# Patient Record
Sex: Male | Born: 1950 | Race: Black or African American | Hispanic: No | Marital: Married | Smoking: Never smoker
Health system: Southern US, Community
[De-identification: ages and names within clinical notes are randomized; demographics above are authoritative.]

## PROBLEM LIST (undated history)

## (undated) DIAGNOSIS — M1990 Unspecified osteoarthritis, unspecified site: Secondary | ICD-10-CM

## (undated) DIAGNOSIS — I10 Essential (primary) hypertension: Secondary | ICD-10-CM

## (undated) DIAGNOSIS — E785 Hyperlipidemia, unspecified: Secondary | ICD-10-CM

## (undated) DIAGNOSIS — H269 Unspecified cataract: Secondary | ICD-10-CM

## (undated) HISTORY — PX: OTHER SURGICAL HISTORY: SHX169

## (undated) HISTORY — DX: Unspecified cataract: H26.9

## (undated) HISTORY — DX: Hyperlipidemia, unspecified: E78.5

## (undated) HISTORY — DX: Unspecified osteoarthritis, unspecified site: M19.90

---

## 1993-04-29 HISTORY — DX: Rider (driver) (passenger) of other motorcycle injured in unspecified traffic accident, initial encounter: V29.99XA

## 1993-04-29 HISTORY — PX: OTHER SURGICAL HISTORY: SHX169

## 1997-11-30 ENCOUNTER — Emergency Department (HOSPITAL_COMMUNITY): Admission: EM | Admit: 1997-11-30 | Discharge: 1997-11-30 | Payer: Self-pay | Admitting: Emergency Medicine

## 1999-01-21 ENCOUNTER — Emergency Department (HOSPITAL_COMMUNITY): Admission: EM | Admit: 1999-01-21 | Discharge: 1999-01-21 | Payer: Self-pay | Admitting: Emergency Medicine

## 1999-01-30 ENCOUNTER — Encounter: Payer: Self-pay | Admitting: Orthopedic Surgery

## 1999-01-30 ENCOUNTER — Ambulatory Visit (HOSPITAL_COMMUNITY): Admission: RE | Admit: 1999-01-30 | Discharge: 1999-01-30 | Payer: Self-pay | Admitting: Orthopedic Surgery

## 2003-04-30 HISTORY — PX: KNEE SURGERY: SHX244

## 2007-10-13 ENCOUNTER — Emergency Department (HOSPITAL_COMMUNITY): Admission: EM | Admit: 2007-10-13 | Discharge: 2007-10-13 | Payer: Self-pay | Admitting: Emergency Medicine

## 2009-12-26 ENCOUNTER — Ambulatory Visit (HOSPITAL_COMMUNITY): Payer: Self-pay | Admitting: Psychology

## 2011-01-14 ENCOUNTER — Emergency Department (HOSPITAL_COMMUNITY): Payer: 59

## 2011-01-14 ENCOUNTER — Emergency Department (HOSPITAL_COMMUNITY)
Admission: EM | Admit: 2011-01-14 | Discharge: 2011-01-14 | Disposition: A | Discharge status: Home / Self Care | Payer: 59 | Attending: Emergency Medicine | Admitting: Emergency Medicine

## 2011-01-14 DIAGNOSIS — J189 Pneumonia, unspecified organism: Secondary | ICD-10-CM | POA: Insufficient documentation

## 2011-01-14 DIAGNOSIS — R0789 Other chest pain: Secondary | ICD-10-CM | POA: Insufficient documentation

## 2011-01-14 DIAGNOSIS — I10 Essential (primary) hypertension: Secondary | ICD-10-CM | POA: Insufficient documentation

## 2011-01-14 DIAGNOSIS — I498 Other specified cardiac arrhythmias: Secondary | ICD-10-CM | POA: Insufficient documentation

## 2011-01-14 DIAGNOSIS — E7800 Pure hypercholesterolemia, unspecified: Secondary | ICD-10-CM | POA: Insufficient documentation

## 2011-01-14 LAB — POCT I-STAT TROPONIN I: Troponin i, poc: 0 ng/mL (ref 0.00–0.08)

## 2011-01-14 LAB — DIFFERENTIAL
Basophils Absolute: 0 10*3/uL (ref 0.0–0.1)
Basophils Relative: 0 % (ref 0–1)
Eosinophils Absolute: 0.1 10*3/uL (ref 0.0–0.7)
Eosinophils Relative: 1 % (ref 0–5)
Lymphocytes Relative: 13 % (ref 12–46)
Lymphs Abs: 1.4 10*3/uL (ref 0.7–4.0)
Monocytes Absolute: 0.6 10*3/uL (ref 0.1–1.0)
Monocytes Relative: 6 % (ref 3–12)
Neutro Abs: 8.8 10*3/uL — ABNORMAL HIGH (ref 1.7–7.7)
Neutrophils Relative %: 81 % — ABNORMAL HIGH (ref 43–77)

## 2011-01-14 LAB — CBC
HCT: 43.9 % (ref 39.0–52.0)
Hemoglobin: 15.1 g/dL (ref 13.0–17.0)
MCH: 27.5 pg (ref 26.0–34.0)
MCHC: 34.4 g/dL (ref 30.0–36.0)
MCV: 79.8 fL (ref 78.0–100.0)
Platelets: 272 10*3/uL (ref 150–400)
RBC: 5.5 MIL/uL (ref 4.22–5.81)
RDW: 14.4 % (ref 11.5–15.5)
WBC: 10.8 10*3/uL — ABNORMAL HIGH (ref 4.0–10.5)

## 2011-01-14 LAB — BASIC METABOLIC PANEL
CO2: 27 mEq/L (ref 19–32)
Calcium: 9.1 mg/dL (ref 8.4–10.5)
Creatinine, Ser: 1.14 mg/dL (ref 0.50–1.35)
GFR calc non Af Amer: >60 mL/min (ref >60)

## 2011-01-14 LAB — TROPONIN I: Troponin I: <0.3 ng/mL (ref <0.30)

## 2011-01-21 ENCOUNTER — Inpatient Hospital Stay (INDEPENDENT_AMBULATORY_CARE_PROVIDER_SITE_OTHER)
Admission: RE | Admit: 2011-01-21 | Discharge: 2011-01-21 | Disposition: A | Discharge status: Home / Self Care | Payer: 59 | Source: Ambulatory Visit | Attending: Family Medicine | Admitting: Family Medicine

## 2011-01-21 ENCOUNTER — Ambulatory Visit (INDEPENDENT_AMBULATORY_CARE_PROVIDER_SITE_OTHER): Payer: 59

## 2011-01-21 DIAGNOSIS — J189 Pneumonia, unspecified organism: Secondary | ICD-10-CM

## 2011-01-24 LAB — RAPID URINE DRUG SCREEN, HOSP PERFORMED
Amphetamines: NOT DETECTED
Benzodiazepines: NOT DETECTED

## 2011-01-24 LAB — BASIC METABOLIC PANEL
Calcium: 9.8
Chloride: 104
Creatinine, Ser: 1.02
GFR calc Af Amer: >60
Sodium: 140

## 2011-01-24 LAB — CBC
RBC: 5.43
WBC: 9.6

## 2011-01-24 LAB — ETHANOL: Alcohol, Ethyl (B): <5

## 2012-05-13 ENCOUNTER — Other Ambulatory Visit (HOSPITAL_COMMUNITY): Payer: Self-pay | Admitting: Family Medicine

## 2012-05-13 ENCOUNTER — Ambulatory Visit: Payer: 59 | Admitting: Family Medicine

## 2012-05-13 ENCOUNTER — Ambulatory Visit (HOSPITAL_COMMUNITY)
Admission: RE | Admit: 2012-05-13 | Discharge: 2012-05-13 | Disposition: A | Discharge status: Home / Self Care | Payer: 59 | Source: Ambulatory Visit | Attending: Family Medicine | Admitting: Family Medicine

## 2012-05-13 DIAGNOSIS — R9431 Abnormal electrocardiogram [ECG] [EKG]: Secondary | ICD-10-CM | POA: Insufficient documentation

## 2012-05-13 DIAGNOSIS — I4949 Other premature depolarization: Secondary | ICD-10-CM

## 2012-05-13 DIAGNOSIS — I498 Other specified cardiac arrhythmias: Secondary | ICD-10-CM | POA: Insufficient documentation

## 2013-06-13 ENCOUNTER — Emergency Department (HOSPITAL_COMMUNITY)
Admission: EM | Admit: 2013-06-13 | Discharge: 2013-06-13 | Disposition: A | Discharge status: Home / Self Care | Payer: 59 | Source: Home / Self Care | Attending: Family Medicine | Admitting: Family Medicine

## 2013-06-13 ENCOUNTER — Encounter (HOSPITAL_COMMUNITY): Payer: Self-pay | Admitting: Emergency Medicine

## 2013-06-13 ENCOUNTER — Emergency Department (INDEPENDENT_AMBULATORY_CARE_PROVIDER_SITE_OTHER): Payer: 59

## 2013-06-13 DIAGNOSIS — M25469 Effusion, unspecified knee: Secondary | ICD-10-CM

## 2013-06-13 DIAGNOSIS — M25462 Effusion, left knee: Secondary | ICD-10-CM

## 2013-06-13 HISTORY — DX: Essential (primary) hypertension: I10

## 2013-06-13 NOTE — ED Notes (Signed)
Reports bowling on 1/29; had some left hamstring pain and started with left knee swelling shortly after that.  Has been walking with a limp.  Has applied heat, ice, and worn knee brace without any improvement.

## 2013-06-13 NOTE — Discharge Instructions (Signed)
Wear ace for comfort, dr olin's office will call on mon for further care of knee problem.
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
## 2015-09-14 ENCOUNTER — Telehealth: Payer: Self-pay | Admitting: Student

## 2015-09-14 ENCOUNTER — Encounter: Payer: Self-pay | Admitting: Student

## 2015-09-14 ENCOUNTER — Ambulatory Visit (INDEPENDENT_AMBULATORY_CARE_PROVIDER_SITE_OTHER): Payer: Medicare Other | Admitting: Student

## 2015-09-14 VITALS — BP 149/83 | HR 59 | Temp 98.1°F | Ht 65.0 in | Wt 185.0 lb

## 2015-09-14 DIAGNOSIS — Z0000 Encounter for general adult medical examination without abnormal findings: Secondary | ICD-10-CM | POA: Diagnosis not present

## 2015-09-14 DIAGNOSIS — I10 Essential (primary) hypertension: Secondary | ICD-10-CM | POA: Diagnosis not present

## 2015-09-14 MED ORDER — HYDROCHLOROTHIAZIDE 25 MG PO TABS: 25.0000 mg | ORAL_TABLET | Freq: Every day | ORAL | Status: DC

## 2015-09-14 NOTE — Telephone Encounter (Signed)
Will route to MD

## 2015-09-14 NOTE — Telephone Encounter (Signed)
Wife called to say that provider forgot to also send to pharmacy rx for pravastatin Sodium (20 mg) and metoprolol tartate (50 mg) [facility] [address]
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
## 2015-09-14 NOTE — Patient Instructions (Addendum)
Follow up in 1 week for blood pressure check You were prescribed hydrochlorothiazide for blood pressure. If you have dizziness with this, stop the blood pressure medicine and call the offcie  Please ask for your health records from your previous provider If you have questions or concerns, call the office at [phone]

## 2015-09-15 ENCOUNTER — Encounter: Payer: Self-pay | Admitting: Student

## 2015-09-15 DIAGNOSIS — Z0000 Encounter for general adult medical examination without abnormal findings: Secondary | ICD-10-CM | POA: Insufficient documentation

## 2015-09-15 DIAGNOSIS — I10 Essential (primary) hypertension: Secondary | ICD-10-CM | POA: Insufficient documentation

## 2015-09-15 DIAGNOSIS — Z1211 Encounter for screening for malignant neoplasm of colon: Secondary | ICD-10-CM | POA: Insufficient documentation

## 2015-09-15 MED ORDER — PRAVASTATIN SODIUM 20 MG PO TABS: 20.0000 mg | ORAL_TABLET | Freq: Every day | ORAL | Status: DC

## 2015-09-15 MED ORDER — METOPROLOL TARTRATE 50 MG PO TABS: 50.0000 mg | ORAL_TABLET | Freq: Every day | ORAL | Status: DC

## 2015-09-15 NOTE — Assessment & Plan Note (Addendum)
Patient will fax his records to our office, including colonoscopy and vaccine records He will bring all medications with him to his next appointment

## 2015-09-15 NOTE — Assessment & Plan Note (Signed)
Given his reported hypertension history and elevated BP in the office today, 149/83, will start him on HCTZ 25 mg qD - BP check in one week - hypotension precautions reviewed

## 2015-09-15 NOTE — Telephone Encounter (Signed)
Patient called the office requesting medications which he did not remem,ber he was on during his visit. He was called to confirm the doses reported to the front office staff but he did not answer. Will prescribe Metoprolol Tartrate 50 daily # 30 and pravastatin 20. Patient previously advised to follow up in one week,. Will discuss at this next visit

## 2015-10-13 ENCOUNTER — Other Ambulatory Visit: Payer: Self-pay | Admitting: *Deleted

## 2015-10-16 MED ORDER — METOPROLOL TARTRATE 50 MG PO TABS: 50.0000 mg | ORAL_TABLET | Freq: Every day | ORAL | Status: DC

## 2016-02-01 ENCOUNTER — Ambulatory Visit (INDEPENDENT_AMBULATORY_CARE_PROVIDER_SITE_OTHER): Payer: Medicare Other | Admitting: Family Medicine

## 2016-02-01 ENCOUNTER — Encounter: Payer: Self-pay | Admitting: Family Medicine

## 2016-02-01 VITALS — BP 125/86 | HR 63 | Temp 98.2°F | Ht 65.0 in | Wt 191.6 lb

## 2016-02-01 DIAGNOSIS — K148 Other diseases of tongue: Secondary | ICD-10-CM | POA: Diagnosis not present

## 2016-02-01 DIAGNOSIS — R42 Dizziness and giddiness: Secondary | ICD-10-CM

## 2016-02-01 DIAGNOSIS — Z23 Encounter for immunization: Secondary | ICD-10-CM | POA: Diagnosis not present

## 2016-02-01 NOTE — Patient Instructions (Signed)
Stroke Prevention °Some health problems and behaviors may make it more likely for you to have a stroke. Below are ways to lessen your risk of having a stroke.  °· Be active for at least 30 minutes on most or all days. °· Do not smoke. Try not to be around others who smoke. °· Do not drink too much alcohol. °¨ Do not have more than 2 drinks a day if you are a man. °¨ Do not have more than 1 drink a day if you are a woman and are not pregnant. °· Eat healthy foods, such as fruits and vegetables. If you were put on a specific diet, follow the diet as told. °· Keep your cholesterol levels under control through diet and medicines. Look for foods that are low in saturated fat, trans fat, cholesterol, and are high in fiber. °· If you have diabetes, follow all diet plans and take your medicine as told. °· Ask your doctor if you need treatment to lower your blood pressure. If you have high blood pressure (hypertension), follow all diet plans and take your medicine as told by your doctor. °· If you are 18-39 years old, have your blood pressure checked every 3-5 years. If you are age 40 or older, have your blood pressure checked every year. °· Keep a healthy weight. Eat foods that are low in calories, salt, saturated fat, trans fat, and cholesterol. °· Do not take drugs. °· Avoid birth control pills, if this applies. Talk to your doctor about the risks of taking birth control pills. °· Talk to your doctor if you have sleep problems (sleep apnea). °· Take all medicine as told by your doctor. °¨ You may be told to take aspirin or blood thinner medicine. Take this medicine as told by your doctor. °¨ Understand your medicine instructions. °· Make sure any other conditions you have are being taken care of. °GET HELP RIGHT AWAY IF: °· You suddenly lose feeling (you feel numb) or have weakness in your face, arm, or leg. °· Your face or eyelid hangs down to one side. °· You suddenly feel confused. °· You have trouble talking (aphasia)  or understanding what people are saying. °· You suddenly have trouble seeing in one or both eyes. °· You suddenly have trouble walking. °· You are dizzy. °· You lose your balance or your movements are clumsy (uncoordinated). °· You suddenly have a very bad headache and you do not know the cause. °· You have new chest pain. °· Your heart feels like it is fluttering or skipping a beat (irregular heartbeat). °Do not wait to see if the symptoms above go away. Get help right away. Call your local emergency services (911 in [place] Do not drive yourself to the hospital. °  °This information is not intended to replace advice given to you by your health care provider. Make sure you discuss any questions you have with your health care provider. °  °Document Released: 10/15/2011 Document Revised: 05/06/2014 Document Reviewed: 10/16/2012 °Elsevier Interactive Patient Education ©2016 Else[employer] Inc[employer] ° °

## 2016-02-01 NOTE — Progress Notes (Signed)
   HPI  CC: Dizziness  John Compton is a 65 y.o. , with the above CC.  Vertigo Patient presents for evaluation of dizziness. The symptoms started 1 week ago and have been intermittent. The attacks occur twice in 1 week, yesterday and 1 week ago and last 1 minute. Positions that worsen symptoms: rolling in bed to the right and walking back from bathroom. Previous workup/treatments: none and no medication. Associated ear symptoms: none. Associated CNS symptoms: none. Recent infections: none. Head trauma: denied. Drug ingestion: none aside from metoprolol, HCTZ, statin- no new meds or changes. Noise exposure: no occupational exposure, no firearm exposure and rides motorcycle. Family history: No strokes, hearing loss, cancers of head/neck. Symptoms was room spinning. Not lightheaded. Felt like falling.     States took BP this AM at 4am when symptoms occurred: 173/76, repeated in 3-4 min and was 131/78 (after laying down) and then again repeat was 101/76.    The following portions of the patient's history were reviewed and updated as appropriate: allergies, current medications, past family history, past medical history, past social history, past surgical history and problem list.  ROS: A 12-point review of systems was performed and negative, except as stated in the above HPI.  OBJECTIVE BP 125/86   Pulse 63   Temp 98.2 F (36.8 C) (Oral)   Ht 5\' 5"  (1.651 m)   Wt 191 lb 9.6 oz (86.9 kg)   BMI 31.88 kg/m  Gen: NAD, alert, cooperative, and pleasant. HEENT: NCAT, EOMI, PERRL CV: RRR, no murmur Resp: CTAB, no wheezes, non-labored Abd: SNTND, BS present, no guarding or organomegaly Ext: No edema, warm Neuro: Alert and oriented, Speech clear, Only gross deficit is deviation of tongue to right.    ASSESSMENT AND PLAN: 1. Dizziness and giddiness - CT HEAD WO CONTRAST; Future  2. Tongue deviation - CT HEAD WO CONTRAST; Future  3. Needs flu shot - Flu shot given today   Discussed with  patient signs/symptoms of stroke and when to seek immediate care at ER. Discussed CT Head to rule out stroke, although unlikely, but given new finding of tongue deviation and frequent dizziness, risk factor of HTN and age. Wife with patient today in office (she is a CNA), and will help to monitor patient. Patient is stable enough to get CT Head as outpatient, no acute urgent findings in office.    Zenda Alpers, [facility] 02/01/2016 11:59 AM

## 2016-02-06 ENCOUNTER — Ambulatory Visit (HOSPITAL_COMMUNITY)
Admission: RE | Admit: 2016-02-06 | Discharge: 2016-02-06 | Disposition: A | Discharge status: Home / Self Care | Payer: Medicare Other | Source: Ambulatory Visit | Attending: Family Medicine | Admitting: Family Medicine

## 2016-02-06 DIAGNOSIS — R42 Dizziness and giddiness: Secondary | ICD-10-CM | POA: Insufficient documentation

## 2016-02-06 DIAGNOSIS — K148 Other diseases of tongue: Secondary | ICD-10-CM | POA: Diagnosis present

## 2016-02-07 ENCOUNTER — Telehealth: Payer: Self-pay | Admitting: *Deleted

## 2016-02-07 NOTE — Telephone Encounter (Signed)
-----   Message from Valerie Roys, [place] sent at 02/07/2016  4:43 PM EDT -----   ----- Message ----- From: Isaias Sakai, DO Sent: 02/07/2016  10:11 AM To: [facility]  Good morning! Please call the patient to let him know his CT of the head did not show any abnormalities or signs/symptoms of a stroke. Please return to the office if his symptoms persist or are getting worse. Thank you. Dr Vanetta Shawl

## 2016-02-07 NOTE — Telephone Encounter (Signed)
Patient is aware of results. John Compton,CMA  

## 2016-02-07 NOTE — Telephone Encounter (Signed)
Pt informed of below. Zimmerman Rumple, Levin Dagostino D, CMA  

## 2016-02-07 NOTE — Telephone Encounter (Signed)
-----   Message from [facility], DO sent at 02/07/2016 10:11 AM EDT ----- Good morning! Please call the patient to let him know his CT of the head did not show any abnormalities or signs/symptoms of a stroke. Please return to the office if his symptoms persist or are getting worse. Thank you. Dr Vanetta Shawl

## 2016-02-13 ENCOUNTER — Encounter: Payer: Self-pay | Admitting: Student

## 2016-02-13 ENCOUNTER — Ambulatory Visit (INDEPENDENT_AMBULATORY_CARE_PROVIDER_SITE_OTHER): Payer: Medicare Other | Admitting: Student

## 2016-02-13 DIAGNOSIS — I10 Essential (primary) hypertension: Secondary | ICD-10-CM | POA: Diagnosis not present

## 2016-02-13 DIAGNOSIS — R42 Dizziness and giddiness: Secondary | ICD-10-CM | POA: Diagnosis not present

## 2016-02-13 MED ORDER — AMOXICILLIN 500 MG PO CAPS: 500.0000 mg | ORAL_CAPSULE | Freq: Three times a day (TID) | ORAL | 0 refills | Status: DC

## 2016-02-13 MED ORDER — ASPIRIN EC 81 MG PO TBEC: 81.0000 mg | DELAYED_RELEASE_TABLET | Freq: Every day | ORAL | 3 refills | Status: DC

## 2016-02-13 NOTE — Progress Notes (Addendum)
Subjective:    Patient ID: John Compton, male    DOB: 17-Mar-1951, 65 y.o.   MRN: [id number]   CC: Dizziness  HPI: 65 y/o male presenting for follow up dizziness  Dizziness - started approximately 2 weeks ago and have been intermittent in nature - he notes the dizziness only when he lays on his left side and the dizziness lasts for 3-4 minutes after he changes position - he feels the " room is spinning" when he has the dizziness - he had a CT head after his last visit which was normal - denies headache - last episode was 2 days ago when he laid on his left side - denies any falls - he has noted that his left ear " feels funny", and irritated and has been for the last 3-4 days - he has had reduces hearing throughout the last 2 weeks, denies tinnitus - he denies sore throat, fevers, runny nose - did not have dizziness during the interview, but when nursing cleaned his ear of wax, he had significant dizziness  Smoking status reviewed  Review of Systems  Per HPI, else denies recent illness, fever, headache, changes in vision, chest pain, shortness of breath, abdominal pain, N/V/D, weakness    Objective:  BP 131/75   Pulse (!) 53   Temp 98.4 F (36.9 C) (Oral)   Wt 191 lb (86.6 kg)   SpO2 98%   BMI 31.78 kg/m  Vitals and nursing note reviewed  General: NAD HEENT: right TM normal, left TM initially obscured by heavy cerumen burden. After cleaning, his TM appeared erythematous with a serous effusion, mild bulging Cardiac: RRR, normal heart sounds Respiratory: CTAB, normal effort Extremities: no edema or cyanosis. Skin: warm and dry,  Neuro: alert and oriented  Cranial Nerves II - XII - II - Visual field intact  III, IV, VI - Extraocular movements intact. V - Facial sensation intact bilaterally. VII - Facial movement intact bilaterally. VIII - Hearing reduced on the left, intact on the right X - Palate elevates symmetrically, no dysarthria. XI - Chin turning & shoulder  shrug intact bilaterally. XII - Tongue protrusion  Deviates to the right  Motor Strength - The patient's strength was 5/5 in all extremities and pronator drift was absent. Bulk was normal and fasciculations were absent.  Motor Tone - Muscle tone was assessed at the neck and appendages and was normal.  Cerebellar: No ataxia on finger-nose-finger bilaterally  Coordination - The patient had normal movements in the hands with no ataxia or dysmetria. Tremor was absent.  Gait and Station - normal gait, normal heel toe gait    Assessment & Plan:    Dizziness History of physical exam fidings most consistent with otitis media with effusion. The intermittent nature of his dizziness when laying on the affected ear, reduced hearing with out tinnitus and now ear irritation and physical exam abnormalities make left ear infection most likely. Differential includes stoke, but less likely given his symptoms are intermittent, and provoked with left ear manipulation (ear cleaning in the office) and laying on left ear. Tongue deviation to the right concerning but unclear how long this finding as been present as the patient was not awake of it. CT head was negative for stroke or other intracranial pathology. Less likely Meniere's given no tinitis. Exam consistent with otitis media with effusion - will treat with 10 days of amoxicilin - f/u in one week to assess - consider ENT referral if effusion not improving within the  next several weeks  Essential hypertension, benign Blood pressure at goal. - CMP at next visit to assess electrolytes, Cr - on pravastatin, aspirin    Deyon Chizek A. Lincoln Brigham MD, [facility] Resident PGY-3 Pager [phone]

## 2016-02-13 NOTE — Patient Instructions (Addendum)
Follow up in 1 week Please take antibiotics three times a day If you have any worsening dizziness, fevers or ear pain call the office You were prescribed aspirin for cardiovascular prevention. Take this once daily You make tay tylenol as needed for ear pain

## 2016-02-14 DIAGNOSIS — R42 Dizziness and giddiness: Secondary | ICD-10-CM | POA: Insufficient documentation

## 2016-02-14 NOTE — Assessment & Plan Note (Addendum)
Blood pressure at goal. - CMP at next visit to assess electrolytes, Cr - on pravastatin, aspirin

## 2016-02-14 NOTE — Assessment & Plan Note (Addendum)
History of physical exam fidings most consistent with otitis media with effusion. The intermittent nature of his dizziness when laying on the affected ear, reduced hearing with out tinnitus and now ear irritation and physical exam abnormalities make left ear infection most likely. Differential includes stoke, but less likely given his symptoms are intermittent, and provoked with left ear manipulation (ear cleaning in the office) and laying on left ear. Tongue deviation to the right concerning but unclear how long this finding as been present as the patient was not awake of it. CT head was negative for stroke or other intracranial pathology. Less likely Meniere's given no tinitis. Exam consistent with otitis media with effusion - will treat with 10 days of amoxicilin - f/u in one week to assess - consider ENT referral if effusion not improving within the next several weeks

## 2016-02-19 ENCOUNTER — Ambulatory Visit: Payer: Medicare Other | Admitting: Student

## 2016-02-23 ENCOUNTER — Ambulatory Visit (INDEPENDENT_AMBULATORY_CARE_PROVIDER_SITE_OTHER): Payer: Medicare Other | Admitting: Student

## 2016-02-23 ENCOUNTER — Encounter: Payer: Self-pay | Admitting: Student

## 2016-02-23 VITALS — BP 121/71 | HR 58 | Temp 97.9°F | Wt 194.0 lb

## 2016-02-23 DIAGNOSIS — Z0000 Encounter for general adult medical examination without abnormal findings: Secondary | ICD-10-CM | POA: Diagnosis not present

## 2016-02-23 DIAGNOSIS — Z1159 Encounter for screening for other viral diseases: Secondary | ICD-10-CM

## 2016-02-23 DIAGNOSIS — R42 Dizziness and giddiness: Secondary | ICD-10-CM | POA: Diagnosis not present

## 2016-02-23 DIAGNOSIS — I10 Essential (primary) hypertension: Secondary | ICD-10-CM

## 2016-02-23 NOTE — Patient Instructions (Signed)
Follow up in 6 months You will have labs today You will receive a letter of the results Please take the rest of your antibiotics I am glad you're feeling better!

## 2016-02-23 NOTE — Assessment & Plan Note (Signed)
Dizziness now resolved with treatment of otitis media

## 2016-02-23 NOTE — Assessment & Plan Note (Signed)
HTN well controlled - continue current regimen - BMP today

## 2016-02-23 NOTE — Assessment & Plan Note (Addendum)
Lipid panel, Hep C today S/p colonoscopy 3 years ago, will request records

## 2016-02-23 NOTE — Progress Notes (Signed)
   Subjective:    Patient ID: John Compton, male    DOB: [birth year], 65 y.o.   MRN: [id number]   CC: follow up dizziness/otitis media  HPI: 65 y/o M with h/o HTN presents for followup dizziness/otitis media  Dizziness/Left otitis media - reports compliance with prescribed antibiotics - dizziness has resolved even despite head position changes - resolved hearing reduction - denies ear pain, fever, headache  Smoking status reviewed  Review of Systems  Per HPI, chest pain, shortness of breath, abdominal pain, N/V/D, weakness    Objective:  BP 121/71   Pulse (!) 58   Temp 97.9 F (36.6 C) (Oral)   Wt 194 lb (88 kg)   SpO2 98%   BMI 32.28 kg/m  Vitals and nursing note reviewed  General: NAD HEENT: bilateral normal TMs, no erythematous, no effusion, Cardiac: RRR,  Respiratory: CTAB, normal effort Extremities: no edema or cyanosis. WWP. Skin: warm and dry, no rashes noted Neuro: alert and oriented,    Assessment & Plan:    Essential hypertension, benign HTN well controlled - continue current regimen - BMP today  Healthcare maintenance Lipid panel today S/p colonoscopy 3 years ago, will request records  Dizziness Dizziness now resolved with treatment of otitis media    Alyssa A. Lincoln Brigham MD, [facility] Resident PGY-3 Pager [phone]

## 2016-02-24 LAB — LIPID PANEL
CHOL/HDL RATIO: 2.9 ratio (ref <=5.0)
Cholesterol: 140 mg/dL (ref 125–200)
HDL: 48 mg/dL (ref >=40)
LDL CALC: 82 mg/dL (ref <130)
Triglycerides: 49 mg/dL (ref <150)
VLDL: 10 mg/dL (ref <30)

## 2016-02-24 LAB — BASIC METABOLIC PANEL WITH GFR
BUN: 11 mg/dL (ref 7–25)
CO2: 28 mmol/L (ref 20–31)
Calcium: 9.8 mg/dL (ref 8.6–10.3)
Chloride: 99 mmol/L (ref 98–110)
Creat: 1.14 mg/dL (ref 0.70–1.25)
GFR, EST AFRICAN AMERICAN: 77 mL/min (ref >=60)
GFR, EST NON AFRICAN AMERICAN: 67 mL/min (ref >=60)
GLUCOSE: 84 mg/dL (ref 65–99)
POTASSIUM: 3.7 mmol/L (ref 3.5–5.3)
Sodium: 138 mmol/L (ref 135–146)

## 2016-02-24 LAB — HEPATITIS C ANTIBODY: HCV AB: NEGATIVE

## 2016-05-28 ENCOUNTER — Other Ambulatory Visit: Payer: Self-pay | Admitting: *Deleted

## 2016-05-29 MED ORDER — METOPROLOL TARTRATE 50 MG PO TABS: 50.0000 mg | ORAL_TABLET | Freq: Every day | ORAL | 5 refills | Status: DC

## 2016-07-29 ENCOUNTER — Other Ambulatory Visit: Payer: Self-pay | Admitting: *Deleted

## 2016-07-29 MED ORDER — HYDROCHLOROTHIAZIDE 25 MG PO TABS: 25.0000 mg | ORAL_TABLET | Freq: Every day | ORAL | 3 refills | Status: DC

## 2016-11-14 ENCOUNTER — Other Ambulatory Visit: Payer: Self-pay | Admitting: Student

## 2016-11-14 DIAGNOSIS — I10 Essential (primary) hypertension: Secondary | ICD-10-CM

## 2016-11-14 MED ORDER — METOPROLOL TARTRATE 50 MG PO TABS: 50.0000 mg | ORAL_TABLET | Freq: Every day | ORAL | 0 refills | Status: DC

## 2016-11-14 MED ORDER — HYDROCHLOROTHIAZIDE 25 MG PO TABS: 25.0000 mg | ORAL_TABLET | Freq: Every day | ORAL | 0 refills | Status: DC

## 2016-11-14 NOTE — Telephone Encounter (Signed)
pt calling to request refill of:  Name of Medication(s):Last date of OV: metropolol, HCTZ Pharmacy:  [facility]   Will route refill request to [employer].  Discussed with patient policy to call pharmacy for future refills.  Also, discussed refills may take up to 48 hours to approve or deny.  Roseanna Rainbow

## 2016-11-21 ENCOUNTER — Ambulatory Visit (INDEPENDENT_AMBULATORY_CARE_PROVIDER_SITE_OTHER): Payer: Medicare Other | Admitting: Student

## 2016-11-21 ENCOUNTER — Encounter: Payer: Self-pay | Admitting: Student

## 2016-11-21 VITALS — BP 132/78 | HR 70 | Temp 98.1°F | Ht 65.0 in | Wt 195.0 lb

## 2016-11-21 DIAGNOSIS — I10 Essential (primary) hypertension: Secondary | ICD-10-CM | POA: Diagnosis not present

## 2016-11-21 DIAGNOSIS — Z0000 Encounter for general adult medical examination without abnormal findings: Secondary | ICD-10-CM

## 2016-11-21 DIAGNOSIS — E785 Hyperlipidemia, unspecified: Secondary | ICD-10-CM | POA: Diagnosis not present

## 2016-11-21 DIAGNOSIS — R0683 Snoring: Secondary | ICD-10-CM

## 2016-11-21 DIAGNOSIS — Z23 Encounter for immunization: Secondary | ICD-10-CM | POA: Diagnosis not present

## 2016-11-21 DIAGNOSIS — N528 Other male erectile dysfunction: Secondary | ICD-10-CM

## 2016-11-21 DIAGNOSIS — G4733 Obstructive sleep apnea (adult) (pediatric): Secondary | ICD-10-CM | POA: Insufficient documentation

## 2016-11-21 MED ORDER — TADALAFIL 10 MG PO TABS: 5.0000 mg | ORAL_TABLET | Freq: Every day | ORAL | 0 refills | Status: DC | PRN

## 2016-11-21 MED ORDER — ATORVASTATIN CALCIUM 20 MG PO TABS: 20.0000 mg | ORAL_TABLET | Freq: Every day | ORAL | 3 refills | Status: DC

## 2016-11-21 MED ORDER — TETANUS-DIPHTH-ACELL PERTUSSIS 5-2.5-18.5 LF-MCG/0.5 IM SUSP: 0.5000 mL | Freq: Once | INTRAMUSCULAR | 0 refills | Status: AC

## 2016-11-21 NOTE — Assessment & Plan Note (Addendum)
Blood pressure is at goal. Noted that he is on metoprolol tartrate 50 mg daily in addition to his HCTZ after he left. No other indication for BB other than hypertension. Called and discussed this with patient.  -Will wean him off this slowly while monitoring his BP.   Patient to check his BP daily and keep log. Patient to let me know if his BP >140/90  Patient to take 25 mg half a tablet for one week.   Then he will stop and continue checking his blood pressure.   We may add Amlodipine if needed.   Follow up in two to three weeks or sooner. Patient will call and schedule -Ambulatory referral to sleep study ordered.

## 2016-11-21 NOTE — Assessment & Plan Note (Signed)
High risk based on STOP-BANG score. Discussed this with patient and ordered referral to sleep clinic for sleep study

## 2016-11-21 NOTE — Assessment & Plan Note (Signed)
Will try cialis. Gave Rx

## 2016-11-21 NOTE — Patient Instructions (Addendum)
It was great seeing you today! We have addressed the following issues today 1. Staying healthy: keep up the exercise. I also recommend eating more vegetables and fruits. See below for more information. Please see you dentist every 6 months.  2. Cholesterol: I have changed your pravastatin to atorvastatin 3. Snoring: I have sent a referral for sleep study. Someone will get in touch with you over the next couple of weeks 4. Screening for colon cancer: please call the number below to schedule for colonoscopy. 5. Tetanus immunization: please take the prescription we gave you to the pharmacy to have a tetanus vaccine.  If we did any lab work today, and the results require attention, either me or my nurse will get in touch with you. If everything is normal, you will get a letter in mail and a message via . If you don't hear from [place] in two weeks, please give [place] a call. Otherwise, we look forward to seeing you again at your next visit. If you have any questions or concerns before then, please call the clinic at [phone].  Please bring all your medications to every doctors visit  Sign up for My Chart to have easy access to your labs results, and communication with your Primary care physician.    Please check-out at the front desk before leaving the clinic.    Take Care,   Dr. Cyndia Skeeters Portion Size    Choose healthier foods such as 100% whole grains, vegetables, fruits, beans, nut seeds, olive oil, most vegetable oils, fat-free dietary, wild game and fish.   Avoid sweet tea, other sweetened beverages, soda, fruit juice, cold cereal and milk and trans fat.   Eat at least 3 meals and 1-2 snacks per day.  Aim for no more than 5 hours between eating.  Eat breakfast within one hour of getting up.    Exercise at least 150 minutes per week, including weight resistance exercises 3 or 4 times per week.   Try to lose at least 7-10% of your current body weight.             Colon  Cancer  People with early colon cancer usually have no warning signs or symptoms.  If found early, most patients can be cured, but if found when it has already spread, the chance of survival is not as good.  Colon cancer is the second most common cause of concern is in the [place] with over 56,000 deaths from colon cancer in 2005  Colon cancer is a common, treatable disease. Screening tests can find a cancer  early, before you have symptoms, and make it more likely that you will survive the disease.  Who needs to be tested? If you are age 45-75 yrs, you should be tested for colon cancer.  Ways to be tested:  A colonoscopy the best test to detect colon cancer. It requires you to drink a bowel preparation to clean out your colon before the test. During this test, a tube with a camera inserted into your rectum and examines your entire colon. You can be given medicine to make you sleepy during the exam. Therefore, you will not be able to drive immediately after the test. There is a small risk of bowel injury during the test.   Stool cards that you can take home and take a sample of your stool is another option. The cards are not as good as colonoscopy at detecting cancer, but the tests are easier and cheaper.  To schedule the colonoscopy, you can call one of the 3 options below:  [facility]. Phone number: [phone]  [facility]. Phone number: [phone]  [facility]: Phone number [phone]
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
## 2016-11-21 NOTE — Assessment & Plan Note (Addendum)
ASCVD risk 16% based on current blood pressure and last lipid panel about 8 months ago. Changed pravastatin to atorvastatin. Taking baby ASA

## 2016-11-21 NOTE — Assessment & Plan Note (Addendum)
Discussed about the need for colon cancer screening and gave him phone numbers to call and schedule for this. Received PCV-13 today. PPSV-23 next year Gave Rx for Tdap Weight is more or less stable. Body mass index is 32.45 kg/m. Reviewed exercise and diet and gave handout.

## 2016-11-22 LAB — CMP14+EGFR
ALBUMIN: 4.1 g/dL (ref 3.6–4.8)
ALT: 16 IU/L (ref 0–44)
AST: 17 IU/L (ref 0–40)
Albumin/Globulin Ratio: 1.5 (ref 1.2–2.2)
Alkaline Phosphatase: 54 IU/L (ref 39–117)
BILIRUBIN TOTAL: 0.5 mg/dL (ref 0.0–1.2)
BUN / CREAT RATIO: 10 (ref 10–24)
BUN: 12 mg/dL (ref 8–27)
CHLORIDE: 99 mmol/L (ref 96–106)
CO2: 25 mmol/L (ref 20–29)
Calcium: 10 mg/dL (ref 8.6–10.2)
Creatinine, Ser: 1.21 mg/dL (ref 0.76–1.27)
GFR calc Af Amer: 71 mL/min/{1.73_m2} (ref >59)
GFR calc non Af Amer: 62 mL/min/{1.73_m2} (ref >59)
GLOBULIN, TOTAL: 2.7 g/dL (ref 1.5–4.5)
Glucose: 92 mg/dL (ref 65–99)
POTASSIUM: 3.6 mmol/L (ref 3.5–5.2)
SODIUM: 139 mmol/L (ref 134–144)
Total Protein: 6.8 g/dL (ref 6.0–8.5)

## 2016-11-22 MED ORDER — METOPROLOL TARTRATE 50 MG PO TABS: 25.0000 mg | ORAL_TABLET | Freq: Every day | ORAL | 0 refills | Status: DC

## 2016-11-29 ENCOUNTER — Encounter: Payer: Self-pay | Admitting: Pulmonary Disease

## 2016-11-29 ENCOUNTER — Ambulatory Visit (INDEPENDENT_AMBULATORY_CARE_PROVIDER_SITE_OTHER): Payer: Medicare Other | Admitting: Pulmonary Disease

## 2016-11-29 VITALS — BP 120/72 | HR 70 | Ht 65.0 in | Wt 198.4 lb

## 2016-11-29 DIAGNOSIS — R0683 Snoring: Secondary | ICD-10-CM

## 2016-11-29 DIAGNOSIS — G4733 Obstructive sleep apnea (adult) (pediatric): Secondary | ICD-10-CM | POA: Diagnosis not present

## 2016-11-29 NOTE — Assessment & Plan Note (Signed)
Given excessive daytime somnolence, narrow pharyngeal exam, witnessed apneas & loud snoring, obstructive sleep apnea is very likely & an overnight polysomnogram will be scheduled as a home study. The pathophysiology of obstructive sleep apnea , it's cardiovascular consequences & modes of treatment including CPAP were discused with the patient in detail & they evidenced understanding.     Home sleep study will be scheduled We discussed treatment options including CPAP and oral device
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
## 2016-11-29 NOTE — Patient Instructions (Signed)
Home sleep study will be scheduled We discussed treatment options including CPAP and oral device

## 2016-12-04 DIAGNOSIS — G4733 Obstructive sleep apnea (adult) (pediatric): Secondary | ICD-10-CM | POA: Diagnosis not present

## 2016-12-06 ENCOUNTER — Other Ambulatory Visit: Payer: Self-pay | Admitting: *Deleted

## 2016-12-06 ENCOUNTER — Telehealth: Payer: Self-pay | Admitting: Pulmonary Disease

## 2016-12-06 DIAGNOSIS — G4733 Obstructive sleep apnea (adult) (pediatric): Secondary | ICD-10-CM

## 2016-12-06 DIAGNOSIS — R0683 Snoring: Secondary | ICD-10-CM

## 2016-12-06 NOTE — Telephone Encounter (Signed)
Per RA, HST showed moderate OSA with an average of 21 events per hour. Severe OSA with 32 events per hour when in the supine position.   Suggests a CPAP titration as the next step. Will order this once patient is aware.

## 2016-12-11 NOTE — Telephone Encounter (Signed)
Spoke with patient. He is aware of results. He has agreed to the CPAP titration study. Will place the order today. Nothing else was needed at time of call.

## 2017-01-24 ENCOUNTER — Ambulatory Visit (HOSPITAL_BASED_OUTPATIENT_CLINIC_OR_DEPARTMENT_OTHER): Payer: Medicare Other | Attending: Pulmonary Disease | Admitting: Pulmonary Disease

## 2017-01-24 DIAGNOSIS — G4733 Obstructive sleep apnea (adult) (pediatric): Secondary | ICD-10-CM | POA: Diagnosis not present

## 2017-01-31 ENCOUNTER — Telehealth: Payer: Self-pay | Admitting: Pulmonary Disease

## 2017-01-31 DIAGNOSIS — G4733 Obstructive sleep apnea (adult) (pediatric): Secondary | ICD-10-CM | POA: Diagnosis not present

## 2017-01-31 NOTE — Telephone Encounter (Signed)
Rx to DME for CPAP  11 cm H2O with a Medium size Fisher&Paykel Nasal Mask Eson2 mask and heated humidification.  OV with TP in 6 wks

## 2017-01-31 NOTE — Procedures (Signed)
Patient Name: John Compton, Feinstein Date: 01/24/2017 Gender: Male D.O.B: [birth year] Age (years): 57 Referring Provider: Kara Mead MD, ABSM Height (inches): 65 Interpreting Physician: Kara Mead MD, ABSM Weight (lbs): 185 RPSGT: Baxter Flattery BMI: 31 MRN: [id number] Neck Size: 17.50   CLINICAL INFORMATION The patient is referred for a CPAP titration to treat sleep apnea.  Date of HST: 11/2016 , AHI 21/h  SLEEP STUDY TECHNIQUE As per the AASM Manual for the Scoring of Sleep and Associated Events v2.3 (April 2016) with a hypopnea requiring 4% desaturations.  The channels recorded and monitored were frontal, central and occipital EEG, electrooculogram (EOG), submentalis EMG (chin), nasal and oral airflow, thoracic and abdominal wall motion, anterior tibialis EMG, snore microphone, electrocardiogram, and pulse oximetry. Continuous positive airway pressure (CPAP) was initiated at the beginning of the study and titrated to treat sleep-disordered breathing.  RESPIRATORY PARAMETERS Optimal PAP Pressure (cm): 11 AHI at Optimal Pressure (/hr): 0.0 Overall Minimal O2 (%): 85.00 Supine % at Optimal Pressure (%): 100 Minimal O2 at Optimal Pressure (%): 94.0   SLEEP ARCHITECTURE The study was initiated at 10:48:50 PM and ended at 4:56:59 AM.  Sleep onset time was 5.9 minutes and the sleep efficiency was 88.0%. The total sleep time was 324.0 minutes.  The patient spent 10.49% of the night in stage N1 sleep, 61.88% in stage N2 sleep, 0.00% in stage N3 and 27.62% in REM.Stage REM latency was 42.0 minutes  Wake after sleep onset was 38.2. Alpha intrusion was absent. Supine sleep was 68.36%.  CARDIAC DATA The 2 lead EKG demonstrated sinus rhythm. The mean heart rate was 68.50 beats per minute. Other EKG findings include: None.   LEG MOVEMENT DATA The total Periodic Limb Movements of Sleep (PLMS) were 0. The PLMS index was 0.00. A PLMS index of <15 is considered normal in adults.  IMPRESSIONS -  The optimal PAP pressure was 11 cm of water. - Central sleep apnea was not noted during this titration (CAI = 0.0/h). - Moderate oxygen desaturations were observed during this titration (min O2 = 85.00%). - No snoring was audible during this study. - No cardiac abnormalities were observed during this study. - Clinically significant periodic limb movements were not noted during this study. Arousals associated with PLMs were rare.   DIAGNOSIS - Obstructive Sleep Apnea (327.23 [G47.33 ICD-10])   RECOMMENDATIONS - Trial of CPAP therapy on 11 cm H2O with a Medium size Fisher&Paykel Nasal Mask Eson2 mask and heated humidification. - Avoid alcohol, sedatives and other CNS depressants that may worsen sleep apnea and disrupt normal sleep architecture. - Sleep hygiene should be reviewed to assess factors that may improve sleep quality. - Weight management and regular exercise should be initiated or continued. - Return to Sleep Center for re-evaluation after 4 weeks of therapy   Kara Mead MD Board Certified in [place]

## 2017-02-04 NOTE — Telephone Encounter (Signed)
Spoke with patient. He is aware of the CPAP order. Verbalized understanding. Will go ahead and place order. Nothing else needed at time of call.

## 2017-03-26 ENCOUNTER — Other Ambulatory Visit: Payer: Self-pay | Admitting: Student

## 2017-03-26 DIAGNOSIS — E785 Hyperlipidemia, unspecified: Secondary | ICD-10-CM

## 2017-03-26 MED ORDER — ASPIRIN EC 81 MG PO TBEC: 81.0000 mg | DELAYED_RELEASE_TABLET | Freq: Every day | ORAL | 3 refills | Status: DC

## 2017-05-06 ENCOUNTER — Ambulatory Visit: Payer: Medicare Other | Admitting: Family Medicine

## 2017-05-06 ENCOUNTER — Other Ambulatory Visit: Payer: Self-pay

## 2017-05-06 ENCOUNTER — Encounter: Payer: Self-pay | Admitting: Family Medicine

## 2017-05-06 DIAGNOSIS — Z23 Encounter for immunization: Secondary | ICD-10-CM | POA: Diagnosis not present

## 2017-05-06 DIAGNOSIS — I10 Essential (primary) hypertension: Secondary | ICD-10-CM

## 2017-05-06 DIAGNOSIS — R059 Cough, unspecified: Secondary | ICD-10-CM

## 2017-05-06 DIAGNOSIS — R05 Cough: Secondary | ICD-10-CM

## 2017-05-06 MED ORDER — OMEPRAZOLE 20 MG PO CPDR: 20.0000 mg | DELAYED_RELEASE_CAPSULE | Freq: Every day | ORAL | 3 refills | Status: DC

## 2017-05-06 NOTE — Assessment & Plan Note (Signed)
New.  Uncertain prognosis.  Most consistent with reflux although could have PND as a cause.  Will do trial of PPI.  If not improving will need imaging and perhaps antihistamine

## 2017-05-06 NOTE — Progress Notes (Signed)
Subjective  Patient is presenting with the following illnesses  COUGH  Has been coughing for 30 days. Cough is: worse at night when he lies down.  Not much during the day.   Sputum production: scant Medications tried: cold medications OTC and cough syryps - did not help Taking blood pressure medications: no  Patient believes might be causing their pain: not sure   Symptoms Runny nose: no Mucous in back of throat: no Throat burning or reflux: sometimes Wheezing or asthma: no Fever: no Chest Pain: no Shortness of breath: no Leg swelling: no Hemoptysis: no Weight loss: no  ROS see HPI Smoking Status noted  HYPERTENSION Disease Monitoring  Home BP Monitoring (Severity) not checking Symptoms - Chest pain- no except with cough    Dyspnea- no Medications (Modifying factors) Compliance-  Off all meds. Lightheadedness-  no  Edema- no Timing - continuous  Duration - years ROS - See HPI  PMH Lab Review   Potassium  Date Value Ref Range Status  11/21/2016 3.6 3.5 - 5.2 mmol/L Final   Sodium  Date Value Ref Range Status  11/21/2016 139 134 - 144 mmol/L Final   Creat  Date Value Ref Range Status  02/23/2016 1.14 0.70 - 1.25 mg/dL Final    Comment:      For patients > or = 68 years of age: The upper reference limit for Creatinine is approximately 13% higher for people identified as African-American.      Creatinine, Ser  Date Value Ref Range Status  11/21/2016 1.21 0.76 - 1.27 mg/dL Final          Chief Complaint noted Review of Symptoms - see HPI PMH - Smoking status noted.    Not using CPAP at home   Objective Vital Signs reviewed Lungs:  Normal respiratory effort, chest expands symmetrically. Lungs are clear to auscultation, no crackles or wheezes. Heart - Regular rate and rhythm.  No murmurs, gallops or rubs.    Neck:  No deformities, thyromegaly, masses, or tenderness noted.   Supple with full range of motion without pain.  Large thick neck Mouth - no  lesions, mucous membranes are moist, no decaying teeth   Throat: normal mucosa, no exudate, uvula midline, no redness  Poorly seen  Extremities:  No cyanosis, edema, or deformity noted with good range of motion of all major joints.       Assessments/Plans  Cough New.  Uncertain prognosis.  Most consistent with reflux although could have PND as a cause.  Will do trial of PPI.  If not improving will need imaging and perhaps antihistamine   Essential hypertension, benign Not controlled today. See after visit summary    See after visit summary for details of patient instuctions

## 2017-05-06 NOTE — Assessment & Plan Note (Signed)
Not controlled today. See after visit summary

## 2017-05-06 NOTE — Patient Instructions (Signed)
Good to see you today!  Thanks for coming in.  For the Cough Take omeprazole one tab an hour before bed.  Your cough should be better in 1-2 weeks.  If not then call us and leave a message for me Dr Erin Hearing and I will prescribe and antihistamine   If you have weight loss or bleeding or fever or cough is not better in 2 months then you should see your regular doctor  For up for your blood pressure as scheduled

## 2017-05-19 ENCOUNTER — Other Ambulatory Visit: Payer: Self-pay

## 2017-05-19 ENCOUNTER — Ambulatory Visit: Payer: Medicare Other | Admitting: Student

## 2017-05-19 ENCOUNTER — Encounter: Payer: Self-pay | Admitting: Student

## 2017-05-19 VITALS — BP 130/78 | HR 70 | Temp 97.5°F | Wt 200.0 lb

## 2017-05-19 DIAGNOSIS — R059 Cough, unspecified: Secondary | ICD-10-CM

## 2017-05-19 DIAGNOSIS — R05 Cough: Secondary | ICD-10-CM

## 2017-05-19 MED ORDER — AZITHROMYCIN 250 MG PO TABS: ORAL_TABLET | ORAL | 0 refills | Status: AC

## 2017-05-19 MED ORDER — ALBUTEROL SULFATE HFA 108 (90 BASE) MCG/ACT IN AERS: 2.0000 | INHALATION_SPRAY | Freq: Four times a day (QID) | RESPIRATORY_TRACT | 0 refills | Status: DC | PRN

## 2017-05-19 NOTE — Patient Instructions (Signed)
It appears that you have a viral upper respiratory infection (Common Cold).  Cold symptoms typically peak at 3-4 days of illness and then gradually improve over 10-14 days. However, a cough may last 3-5 weeks.   - A tablespoonful of honey before bedtime is helpful for cough - Get plenty of rest and adequate hydration. - Consume warm fluids (soup or tea). It relieves stuffy nose, and to loosen phlegm. -We also sent a prescription for albuterol inhaler to your pharmacy.  You can use this as needed for coughing.   -If there is no improvement with the above measures in the next 3 days or if your symptoms get worse, you can fill the prescription for antibiotic.   CONTACT YOUR DOCTOR IF YOU EXPERIENCE ANY OF THE FOLLOWING: - High fever, chest pain, shortness of breath or  not able to keep down food or fluids.  - Cough that gets worse while other cold symptoms improve - Flare up of any chronic lung problem, such as asthma - Your symptoms persist longer than 2 weeks
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
## 2017-07-28 ENCOUNTER — Ambulatory Visit (INDEPENDENT_AMBULATORY_CARE_PROVIDER_SITE_OTHER): Payer: Medicare Other | Admitting: Internal Medicine

## 2017-07-28 ENCOUNTER — Other Ambulatory Visit: Payer: Self-pay

## 2017-07-28 ENCOUNTER — Encounter: Payer: Self-pay | Admitting: Internal Medicine

## 2017-07-28 VITALS — BP 142/88 | HR 65 | Temp 97.9°F | Ht 65.0 in | Wt 196.4 lb

## 2017-07-28 DIAGNOSIS — L0291 Cutaneous abscess, unspecified: Secondary | ICD-10-CM | POA: Diagnosis not present

## 2017-07-28 MED ORDER — AMOXICILLIN-POT CLAVULANATE 875-125 MG PO TABS: 1.0000 | ORAL_TABLET | Freq: Two times a day (BID) | ORAL | 0 refills | Status: DC

## 2017-07-28 MED ORDER — DOXYCYCLINE HYCLATE 100 MG PO TABS: 100.0000 mg | ORAL_TABLET | Freq: Two times a day (BID) | ORAL | 0 refills | Status: DC

## 2017-07-28 NOTE — Assessment & Plan Note (Signed)
-  Despite already draining and contained lesion, there is concern he could develop a more extensive hand infection such as infectious tenosynovitis. Appears to have started as a wart.  -Precepted with Dr. McDiarmid.  Recommended continuing warm soaks of hand to encourage drainage.  Will cover for MRSA, strep and Eikenella with Augmentin and doxycycline twice daily for 5 days.  Had Tdap within the last 6 months at his local pharmacy. -Recommended patient return towards the end of the week to assess for improvement. -Reviewed red flag symptoms of developing surrounding erythema, fevers, or having worsening swelling and that these would be reasons to go to the emergency department.

## 2017-07-28 NOTE — Progress Notes (Signed)
[facility] Progress Note  Subjective:  John Compton is a 67 y.o. male history of hypertension and OSA who presents for right hand wound.  He reports area that looked like a wart arose about 3 weeks ago he has been applying a topical wart solution.  He does report biting off the top of this lesion a couple weeks ago, which caused bleeding.  Today when he was at the gym weight, yellowish pus squirted out from the area.  He did try a warm soak of his hand last night.  He and his wife have observed that his right hand looks a little more swollen compared to the left.  He denies fevers, chills, change in appetite, redness of the hand, and much pain aside from when he is using his hands a lot.  He says he has had wart before but many years ago.  No Known Allergies  Social History   Tobacco Use  . Smoking status: Never Smoker  . Smokeless tobacco: Never Used  Substance Use Topics  . Alcohol use: No    Objective: Blood pressure (!) 142/88, pulse 65, temperature 97.9 F (36.6 C), temperature source Oral, height 5\' 5"  (1.651 m), weight 196 lb 6.4 oz (89.1 kg), SpO2 97 %. Body mass index is 32.68 kg/m. Constitutional: Pleasant, well-appearing male in no apparent distress. MSK: Slight decrease in landmarks over dorsal surface of right hand near lateral aspect of the wrist compared to those on the left hand.  Full range of motion of hand. Skin: Approximately 1.5 cm round, dry and scaly lesion with central opening over dorsal surface of hand between fifth and fourth fingers. Scant bloody fluid can be expressed. Psychiatric: Normal mood and affect.  Vitals reviewed      Assessment/Plan: Abscess -Despite already draining and contained lesion, there is concern he could develop a more extensive hand infection such as infectious tenosynovitis. Appears to have started as a wart.  -Precepted with Dr. McDiarmid.  Recommended continuing warm soaks of hand to encourage drainage.  Will cover  for MRSA, strep and Eikenella with Augmentin and doxycycline twice daily for 5 days.  Had Tdap within the last 6 months at his local pharmacy. -Recommended patient return towards the end of the week to assess for improvement. -Reviewed red flag symptoms of developing surrounding erythema, fevers, or having worsening swelling and that these would be reasons to go to the emergency department.  Follow-up in 3-4 days.  Olene Floss, MD [place], PGY-3

## 2017-07-28 NOTE — Patient Instructions (Signed)
Mr. Breit,  Please continue warm soaks to encourage drainage several times a day.  Take doxycycline and augmentin twice daily for the next 5 days.  Return towards the end of the week to ensure improvement.  If you have dramatic change in swelling of hand, please go to the Emergency Department.  Best, Dr. Ola Spurr

## 2017-08-01 ENCOUNTER — Encounter: Payer: Self-pay | Admitting: Family Medicine

## 2017-08-01 ENCOUNTER — Ambulatory Visit: Payer: Medicare Other | Admitting: Family Medicine

## 2017-08-01 ENCOUNTER — Other Ambulatory Visit: Payer: Self-pay

## 2017-08-01 VITALS — BP 128/84 | HR 68 | Temp 97.8°F | Ht 65.0 in | Wt 195.0 lb

## 2017-08-01 DIAGNOSIS — L0291 Cutaneous abscess, unspecified: Secondary | ICD-10-CM

## 2017-08-01 DIAGNOSIS — B079 Viral wart, unspecified: Secondary | ICD-10-CM | POA: Diagnosis not present

## 2017-08-01 NOTE — Progress Notes (Signed)
    Subjective:  John Compton is a 67 y.o. male who presents to the [facility] today with a chief complaint of infected wart on right hand followup.   Has been hot soaking hand using abx as instructed with near full healing of infection drainage/erythema.  He still has open wound on wart though because it rips when he lifts heavy wegiths with it.  No pain beyond site, no fevers, no chills, no distal perfusion/sensation/motor control problems.  No joint pain concerning for osteomyelitis.  Objective:  Physical Exam: BP 128/84   Pulse 68   Temp 97.8 F (36.6 C) (Oral)   Ht 5\' 5"  (1.651 m)   Wt 195 lb (88.5 kg)   SpO2 98%   BMI 32.45 kg/m   Gen: NAD, conversing comfortably, muscular for age CV: RRR with no murmurs appreciated Pulm: NWOB, CTAB with no crackles, wheezes, or rhonchi GI: Normal bowel sounds present. Soft, Nontender, Nondistended. MSK: no edema, cyanosis, or clubbing noted Skin: 1.5cm diameter raised wart in web of thumb of right hand with bloody top secondary to scab being ripped off, no surrounding erythema/drainage although it does have firm base app 3cm below raised portion Neuro: grossly normal, moves all extremities Psych: Normal affect and thought content  No results found for this or any previous visit (from the past 72 hour(s)).   Assessment/Plan:  Viral warts On Right hand in web of thumb, had been infected but treated with abx and healing well  Infection healing well, keeps reopening because he weightlifts so much.  He will be more careful with hand to not reopen wound onwart and reschedule a cryo appt.  Abscess Healing well with no more drainage, no erythema.  Head of wart is still open from weightlifting with it and ripping off scab.    Plan to continue abx for full course, protect wound to prevent reopening and eventually cryo the wart to resolve faster than with anti-wart cream   Sherene Sires, [place] - PGY1 08/01/2017 3:17 PM

## 2017-08-01 NOTE — Assessment & Plan Note (Signed)
Healing well with no more drainage, no erythema.  Head of wart is still open from weightlifting with it and ripping off scab.    Plan to continue abx for full course, protect wound to prevent reopening and eventually cryo the wart to resolve faster than with anti-wart cream

## 2017-08-01 NOTE — Patient Instructions (Signed)
It was a pleasure to see you today! Thank you for choosing [facility] for your primary care. John Compton was seen for hand infection followup. Come back to the clinic once the infection finishes clearing and we can try to freeze the wart, and go to the emergency room if you have any life threatening concerns.  IT appears that your hand is healing on schedule.  Please continue your meds as scheduled and call us in a few days to schedule an appt to try and freeze the wart    If we did any lab work today, and the results require attention, either me or my nurse will get in touch with you. If everything is normal, you will get a letter in mail and a message via . If you don't hear from [place] in two weeks, please give [place] a call. Otherwise, we look forward to seeing you again at your next visit. If you have any questions or concerns before then, please call the clinic at [phone].  Please bring all your medications to every doctors visit  Sign up for My Chart to have easy access to your labs results, and communication with your Primary care physician.    Please check-out at the front desk before leaving the clinic.    Best,  Dr. Sherene Sires FAMILY MEDICINE RESIDENT - PGY1 08/01/2017 3:07 PM

## 2017-08-01 NOTE — Assessment & Plan Note (Signed)
On Right hand in web of thumb, had been infected but treated with abx and healing well  Infection healing well, keeps reopening because he weightlifts so much.  He will be more careful with hand to not reopen wound onwart and reschedule a cryo appt.

## 2017-08-07 ENCOUNTER — Encounter: Payer: Self-pay | Admitting: Internal Medicine

## 2017-08-07 ENCOUNTER — Ambulatory Visit: Payer: Medicare Other | Admitting: Internal Medicine

## 2017-08-07 ENCOUNTER — Other Ambulatory Visit: Payer: Self-pay

## 2017-08-07 DIAGNOSIS — L989 Disorder of the skin and subcutaneous tissue, unspecified: Secondary | ICD-10-CM | POA: Diagnosis not present

## 2017-08-07 NOTE — Patient Instructions (Signed)
It was so nice to meet you!  We have frozen off your wart today. You may notice that it forms a blister over the next couple of days. You should continue to put antibiotic ointment on it and keep it covered.  If it is not better in the next 1-2 weeks, please come back to see [place]!  -Dr. Brett Albino

## 2017-08-08 DIAGNOSIS — L989 Disorder of the skin and subcutaneous tissue, unspecified: Secondary | ICD-10-CM | POA: Insufficient documentation

## 2017-08-08 NOTE — Assessment & Plan Note (Signed)
Thought to be viral wart. S/p 5 day antibiotic course with Augmentin and Doxycycline due to abscess formation. Could also be pyogenic granuloma, given the appearance of the lesion. - Cryotherapy performed in clinic today - Advised patient to use antibiotic ointment and keep the lesion covered for the next 24 hours. - Follow-up with PCP - If no improvement after cryotherapy, can consider referral for surgical excision

## 2017-08-08 NOTE — Progress Notes (Signed)
   [facility] Phone: [phone]  Subjective:  John Compton is a 67 year old male presenting to clinic with a wart on his right hand for the last month. The wart has been continuously bleeding if he bumps it on something or if he puts his hand in his pocket. He has been picking and biting at the wart. He was seen in clinic on 4/1 with purulent drainage from the wart. He was prescribed Augmentin and Doxycycline x 5 days. His purulent drainage improved after taking the antibiotics. He followed up in clinic on 4/5 for recheck. He did not have any drainage or signs of infection at the time. He was advised to finish the course of antibiotics. He returns to clinic today to have the wart frozen off. He has tried using compound W at home, which hasn't helped at all. No fevers, no chills.   ROS: See HPI for pertinent positives and negatives  Past Medical History- HTN, OSA, HLD  Family history reviewed for today's visit. No changes.  Social history- patient is a never smoker  Objective: BP 118/66   Pulse 85   Wt 194 lb (88 kg)   SpO2 96%   BMI 32.28 kg/m  Gen: NAD, alert, cooperative with exam Skin: 1cm x 1cm lesion present on the dorsum of the hand between the thumb and index finger; lesion is open and actively bleeding; no purulent drainage; no surrounding erythema or warmth; +mild tenderness to palpation.  Assessment/Plan: Hand Lesion: Thought to be viral wart. S/p 5 day antibiotic course with Augmentin and Doxycycline due to abscess formation. Could also be pyogenic granuloma, given the appearance of the lesion. - Cryotherapy performed in clinic today - Advised patient to use antibiotic ointment and keep the lesion covered for the next 24 hours. - Follow-up with PCP - If no improvement after cryotherapy, can consider referral for surgical excision   Hyman Bible, MD PGY-3

## 2017-09-15 ENCOUNTER — Ambulatory Visit (INDEPENDENT_AMBULATORY_CARE_PROVIDER_SITE_OTHER): Payer: Medicare Other | Admitting: Internal Medicine

## 2017-09-15 ENCOUNTER — Other Ambulatory Visit: Payer: Self-pay

## 2017-09-15 ENCOUNTER — Encounter: Payer: Self-pay | Admitting: Internal Medicine

## 2017-09-15 DIAGNOSIS — L989 Disorder of the skin and subcutaneous tissue, unspecified: Secondary | ICD-10-CM

## 2017-09-15 NOTE — Patient Instructions (Signed)
It was so nice to see you today!  Please keep the area covered and put antibiotic ointment on it.  We will see you back in 2 weeks.  -Dr. Brett Albino

## 2017-09-17 NOTE — Progress Notes (Signed)
   [facility] Phone: [phone]  Subjective:  John Compton is a 67 year old male presenting to clinic with a hand lesion. He was initially seen for this on 4/1. He was thought to have an abscess at that time and was treated with Augmentin and Doxycycline. He was seen again on 08/01/17 for follow-up. The lesion had improved at that time and he was told to finish out the course of antibiotics. He followed up on 08/07/17 for wart removal. He was thought to have a wart vs pyogenic granuloma. He was treated with cryotherapy to see if that would improve the lesion. He returns today for follow-up. The lesion almost completely went away after cryotherapy, but then returned a couple of weeks ago. The lesion bleeds very easily. No drainage, no spreading redness, no fevers.   ROS: See HPI for pertinent positives and negatives  Past Medical History- HTN, OSA, HLD  Family history reviewed for today's visit. No changes.  Social history- patient is a never smoker.   Objective: BP 120/68   Pulse 65   Temp 98.3 F (36.8 C) (Oral)   Wt 192 lb (87.1 kg)   SpO2 98%   BMI 31.95 kg/m  Gen: NAD, alert, cooperative with exam Skin: lesion present on the right hand (see picture below), no drainage, no surrounding erythema.     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 Initially seemed to have a wart-like appearance, but it has not improved with cryotherapy. - Cautery and curettage performed in clinic today with Dr. Walker Kehr. Patient tolerated procedure without any issues.  - Patient given aftercare instructions. - Use antibiotic ointment to the area a couple times per day - Return to clinic in 2 weeks for recheck   Hyman Bible, MD PGY-3

## 2017-09-17 NOTE — Assessment & Plan Note (Addendum)
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 Initially seemed to have a wart-like appearance, but it has not improved with cryotherapy. - Cautery and curettage performed in clinic today with Dr. Walker Kehr. Patient tolerated procedure without any issues.  - Patient given aftercare instructions. - Use antibiotic ointment to the area a couple times per day - Return to clinic in 2 weeks for recheck

## 2017-10-01 ENCOUNTER — Other Ambulatory Visit: Payer: Self-pay

## 2017-10-01 ENCOUNTER — Ambulatory Visit (INDEPENDENT_AMBULATORY_CARE_PROVIDER_SITE_OTHER): Payer: Medicare Other | Admitting: Internal Medicine

## 2017-10-01 ENCOUNTER — Encounter: Payer: Self-pay | Admitting: Internal Medicine

## 2017-10-01 VITALS — BP 141/78 | HR 65 | Temp 98.4°F | Wt 190.0 lb

## 2017-10-01 DIAGNOSIS — L989 Disorder of the skin and subcutaneous tissue, unspecified: Secondary | ICD-10-CM | POA: Diagnosis not present

## 2017-10-01 DIAGNOSIS — L980 Pyogenic granuloma: Secondary | ICD-10-CM

## 2017-10-01 NOTE — Progress Notes (Signed)
   [facility] Phone: [phone]  Subjective:  John Compton is a 67 year old male presenting to clinic for follow-up of his hand lesion. He has been seen in clinic multiple times for this. He is s/p cryotherapy on 08/07/17 with initial improvement of the lesion and then subsequent reappearance of the lesion. He was then seen in clinic on 09/15/17. He was thought to have a pyogenic granuloma and cautery and curettage was performed in clinic. He returns today for follow-up. The hand lesion went away from 4-5 days and then returned. The hand lesion has been bleeding easily. No purulent drainage. No surrounding erythema. No fevers, no chills.  ROS: See HPI for pertinent positives and negatives  Past Medical History- HTN, OSA, HLD  Family history reviewed for today's visit. No changes.  Social history- patient is a never smoker  Objective: BP (!) 141/78   Pulse 65   Temp 98.4 F (36.9 C) (Oral)   Wt 190 lb (86.2 kg)   SpO2 97%   BMI 31.62 kg/m  Gen: NAD, alert, cooperative with exam Skin: right hand with lesion present (see below), no drainage, no surrounding erythema.     Assessment/Plan: Hand Lesion: Consistent with pyogenic granuloma. Continues to reappear s/p treatment with cryotherapy on 08/07/17 and cautery and curettage on 09/15/17. No signs of infection on exam. - Given persistence despite multiple treatment attempts, will refer to dermatology for excision - Follow-up with PCP as needed  Hyman Bible, MD PGY-3

## 2017-10-01 NOTE — Patient Instructions (Addendum)
It was so nice to see you today!  I'm so sorry that your hand lesion keeps coming back! I have sent in a referral to the dermatologist. You should hear from our office in the next 2 weeks to schedule that appointment.  -Dr. Brett Albino

## 2017-10-03 NOTE — Assessment & Plan Note (Signed)
Consistent with pyogenic granuloma. Continues to reappear s/p treatment with cryotherapy on 08/07/17 and cautery and curettage on 09/15/17. No signs of infection on exam. - Given persistence despite multiple treatment attempts, will refer to dermatology for excision - Follow-up with PCP as needed

## 2017-10-08 ENCOUNTER — Telehealth: Payer: Self-pay

## 2017-10-08 NOTE — Telephone Encounter (Signed)
Information faxed. Katharina Caper, April D, [place]

## 2017-10-08 NOTE — Telephone Encounter (Signed)
Amy from [facility] called nurse line requesting pts demographic info be faxed over. Fax number given [phone].

## 2017-12-11 ENCOUNTER — Encounter: Payer: Medicare Other | Admitting: Family Medicine

## 2017-12-15 ENCOUNTER — Other Ambulatory Visit: Payer: Self-pay

## 2017-12-15 DIAGNOSIS — E785 Hyperlipidemia, unspecified: Secondary | ICD-10-CM

## 2017-12-15 MED ORDER — ATORVASTATIN CALCIUM 20 MG PO TABS: 20.0000 mg | ORAL_TABLET | Freq: Every day | ORAL | 3 refills | Status: DC

## 2018-01-12 ENCOUNTER — Ambulatory Visit (INDEPENDENT_AMBULATORY_CARE_PROVIDER_SITE_OTHER): Payer: Medicare Other | Admitting: Family Medicine

## 2018-01-12 ENCOUNTER — Other Ambulatory Visit: Payer: Self-pay

## 2018-01-12 ENCOUNTER — Encounter: Payer: Self-pay | Admitting: Family Medicine

## 2018-01-12 VITALS — BP 132/78 | HR 69 | Temp 98.3°F | Wt 191.0 lb

## 2018-01-12 DIAGNOSIS — E785 Hyperlipidemia, unspecified: Secondary | ICD-10-CM

## 2018-01-12 DIAGNOSIS — Z1211 Encounter for screening for malignant neoplasm of colon: Secondary | ICD-10-CM | POA: Diagnosis not present

## 2018-01-12 DIAGNOSIS — Z23 Encounter for immunization: Secondary | ICD-10-CM | POA: Diagnosis not present

## 2018-01-12 NOTE — Progress Notes (Deleted)
    Subjective:  Trenden Hazelrigg is a 67 y.o. male who presents to the [facility] today with a chief complaint of ***.   HPI:   ***HIST  Objective:  Physical Exam: BP 132/78   Pulse 69   Temp 98.3 F (36.8 C) (Oral)   Wt 191 lb (86.6 kg)   SpO2 97%   BMI 31.78 kg/m   Gen: ***NAD, resting comfortably CV: RRR with no murmurs appreciated Pulm: NWOB, CTAB with no crackles, wheezes, or rhonchi GI: Normal bowel sounds present. Soft, Nontender, Nondistended. MSK: no edema, cyanosis, or clubbing noted Skin: warm, dry Neuro: grossly normal, moves all extremities Psych: Normal affect and thought content  No results found for this or any previous visit (from the past 72 hour(s)).   Assessment/Plan:  No problem-specific Assessment & Plan notes found for this encounter.   Sherene Sires, DO FAMILY MEDICINE RESIDENT - PGY2 01/12/2018 2:12 PM

## 2018-01-12 NOTE — Assessment & Plan Note (Signed)
Patient wanting to recheck lipid in hopes of coming of his medicine.  Will recheck his risk score after results of lipid panel drawn today
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
## 2018-01-12 NOTE — Assessment & Plan Note (Signed)
Patient says it's >55yrs since last colonoscopy, says results were normal.  No symptoms  Referral to GI

## 2018-01-13 LAB — LIPID PANEL
CHOL/HDL RATIO: 2 ratio (ref 0.0–5.0)
Cholesterol, Total: 122 mg/dL (ref 100–199)
HDL: 60 mg/dL (ref >39)
LDL CALC: 53 mg/dL (ref 0–99)
Triglycerides: 45 mg/dL (ref 0–149)
VLDL Cholesterol Cal: 9 mg/dL (ref 5–40)

## 2018-01-19 ENCOUNTER — Telehealth: Payer: Self-pay

## 2018-01-19 NOTE — Telephone Encounter (Signed)
Pt would like results from lab test. Pt would like to get off of cholesterol medication. Please call pt @ [phone] with results. Ottis Stain, CMA

## 2018-01-22 ENCOUNTER — Telehealth: Payer: Self-pay | Admitting: *Deleted

## 2018-01-22 NOTE — Telephone Encounter (Signed)
-----   Message from Sherene Sires, DO sent at 01/21/2018  8:07 PM EDT ----- Regarding: cholesterol medication "John Compton, your cholesterol labs were actually quite good and Dr. Criss Rosales absolutely supports your right to decide you don't want a medication but the conversation about pros/cons is a little more complicate than just a quick phone message.  He'd like you to come in and discuss it if you can"

## 2018-01-23 NOTE — Telephone Encounter (Signed)
LVM to call office to inform him of below and assist him in scheduling an appointment if he would like. Katharina Caper, April D, [place]

## 2018-01-30 NOTE — Telephone Encounter (Signed)
Contacted pt and gave him the below message and scheduled appointment to discuss this. Katharina Caper, Robey Massmann D, [place]

## 2018-03-03 ENCOUNTER — Encounter: Payer: Self-pay | Admitting: Family Medicine

## 2018-03-03 ENCOUNTER — Other Ambulatory Visit: Payer: Self-pay

## 2018-03-03 ENCOUNTER — Ambulatory Visit (INDEPENDENT_AMBULATORY_CARE_PROVIDER_SITE_OTHER): Payer: Medicare Other | Admitting: Family Medicine

## 2018-03-03 VITALS — BP 124/84 | HR 80 | Temp 97.9°F | Ht 65.0 in | Wt 186.0 lb

## 2018-03-03 DIAGNOSIS — I10 Essential (primary) hypertension: Secondary | ICD-10-CM | POA: Diagnosis not present

## 2018-03-03 DIAGNOSIS — E785 Hyperlipidemia, unspecified: Secondary | ICD-10-CM | POA: Diagnosis not present

## 2018-03-03 MED ORDER — ATORVASTATIN CALCIUM 20 MG PO TABS: 10.0000 mg | ORAL_TABLET | Freq: Every day | ORAL | 0 refills | Status: DC

## 2018-03-03 NOTE — Progress Notes (Signed)
    Subjective:  John Compton is a 67 y.o. male who presents to the [facility] today with a chief complaint of medication discussion.   HPI: Patient would like to discuss goal of reducing medication.  We went over his calculated ascvd risk and discussed pros and cons and discussed options to reduce/eliminate or continue.  Objective:  Physical Exam: BP 124/84   Pulse 80   Temp 97.9 F (36.6 C) (Oral)   Ht 5\' 5"  (1.651 m)   Wt 186 lb (84.4 kg)   SpO2 98%   BMI 30.95 kg/m   Gen: NAD, resting comfortably, muscular CV: RRR with no murmurs appreciated Pulm: NWOB, CTAB with no crackles, wheezes, or rhonchi GI: Normal bowel sounds present. Soft, Nontender, Nondistended. MSK: no edema, cyanosis, or clubbing noted Skin: warm, dry Neuro: grossly normal, moves all extremities Psych: Normal affect and thought content  No results found for this or any previous visit (from the past 72 hour(s)).   Assessment/Plan:  Hyperlipidemia We discussed his good cholesterol labs.  There is a question from patient abotu whether he is good enough to not need med or if he is good BECAUSE of the med.  He would like to reduce medicine and recheck in a few months, we went over ascvd risk and we are supporting his autonomy on this decision.  Essential hypertension, benign Well controlled, no changes intended   Sherene Sires, [place] - PGY2 03/05/2018 2:49 PM

## 2018-03-05 NOTE — Assessment & Plan Note (Signed)
Well controlled, no changes intended

## 2018-03-05 NOTE — Patient Instructions (Signed)
avs declined

## 2018-03-05 NOTE — Assessment & Plan Note (Signed)
We discussed his good cholesterol labs.  There is a question from patient abotu whether he is good enough to not need med or if he is good BECAUSE of the med.  He would like to reduce medicine and recheck in a few months, we went over ascvd risk and we are supporting his autonomy on this decision.

## 2018-03-30 ENCOUNTER — Other Ambulatory Visit: Payer: Self-pay | Admitting: *Deleted

## 2018-03-30 DIAGNOSIS — E785 Hyperlipidemia, unspecified: Secondary | ICD-10-CM

## 2018-03-31 MED ORDER — ASPIRIN EC 81 MG PO TBEC: 81.0000 mg | DELAYED_RELEASE_TABLET | Freq: Every day | ORAL | 3 refills | Status: DC

## 2018-04-07 ENCOUNTER — Telehealth: Payer: Self-pay | Admitting: Family Medicine

## 2018-04-07 NOTE — Telephone Encounter (Signed)
Spoke with pt about overdue colonoscopy. Pt states they were seen somewhere over by [facility] within the last 10 years for a colonoscopy. I will try to see if records can be found. -[place]

## 2018-09-07 ENCOUNTER — Telehealth: Payer: Self-pay | Admitting: *Deleted

## 2018-09-07 NOTE — Telephone Encounter (Addendum)
LVM to call office back to inform pt that he is due for his AWV and that we are now offering them either in phone or virtual.  WAnted to see if he would like to schedule this with his PCP this Thursday 5/14.  If pt calls back and would like to schedule if slot still available please assist him in doing this. Dashanna Kinnamon Zimmerman Rumple, CMA

## 2018-09-13 IMAGING — CT CT HEAD W/O CM
3 series · 15 of 47 positions shown, 18 images · non-contrast
Comparison: None.

CLINICAL DATA: Dizziness, fell like he was falling when laying in
bed.

EXAM:
CT HEAD WITHOUT CONTRAST
TECHNIQUE: Contiguous axial images were obtained from the base of the skull
through the vertex without intravenous contrast.

[Series 2: head 5.0 h30s · axial · 0.40mm/px · z∈[-60,+65]mm · 9 of 31 slices shown, 12 images]
[im 3/31  brain]
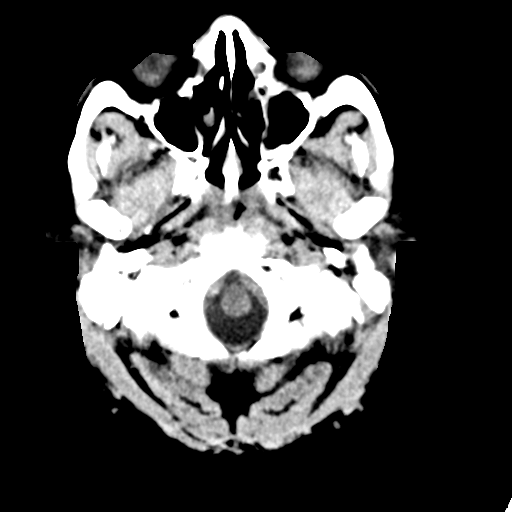
[im 3/31  bone]
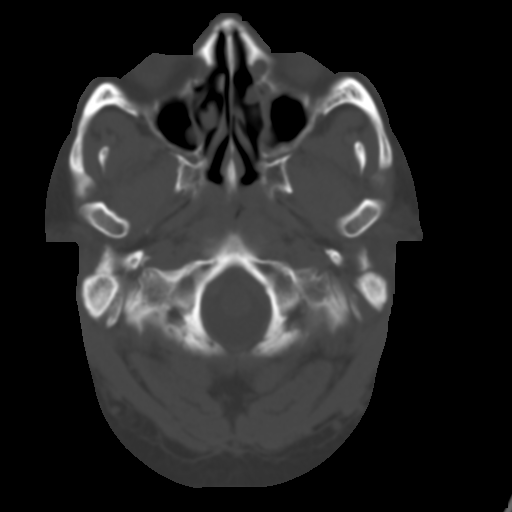
[im 6/31  brain]
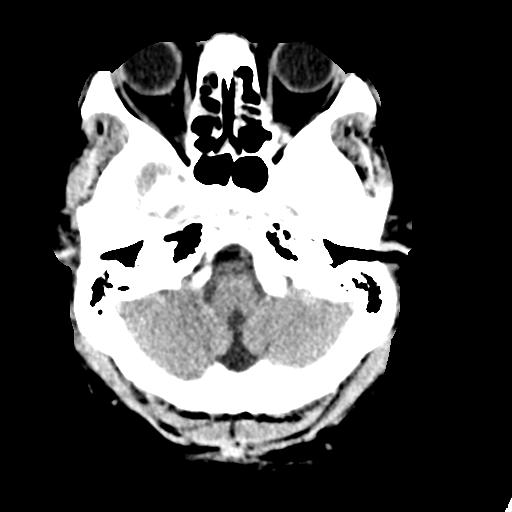
[im 9/31  brain]
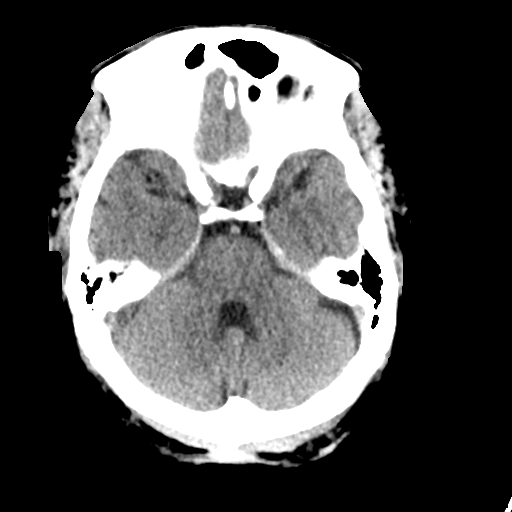
[im 12/31  brain]
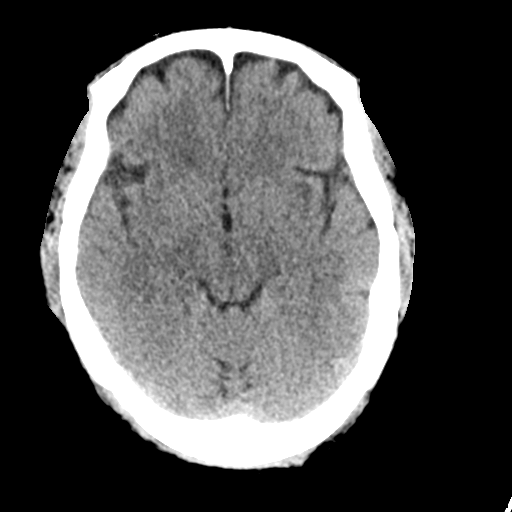
[im 16/31  brain]
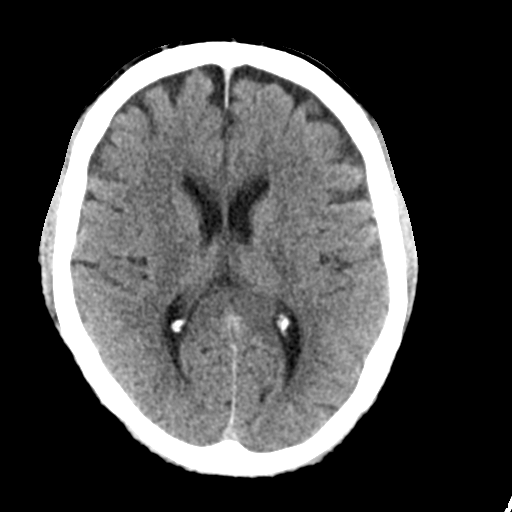
[im 16/31  bone]
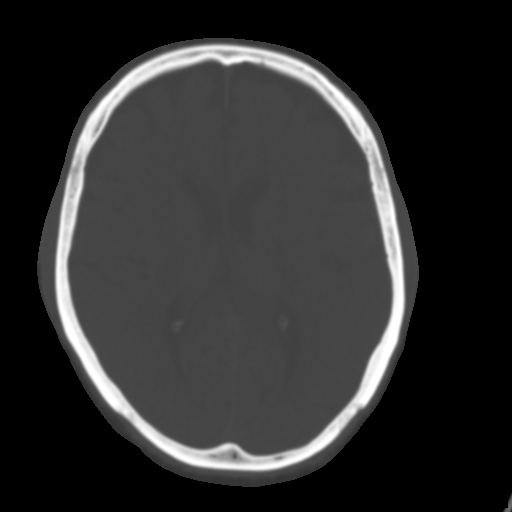
[im 19/31  brain]
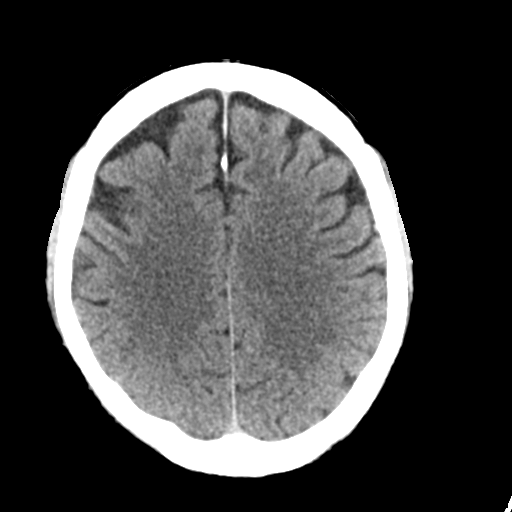
[im 22/31  brain]
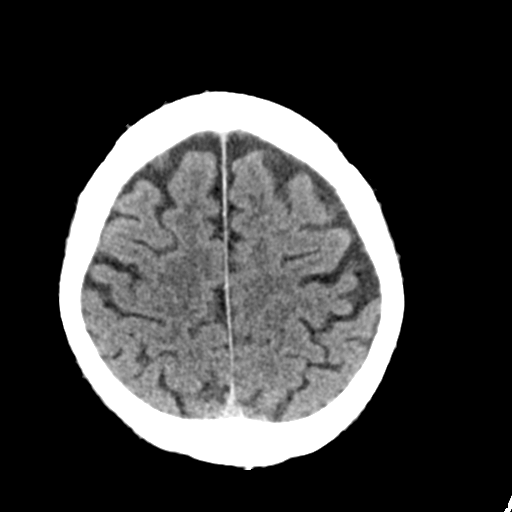
[im 25/31  brain]
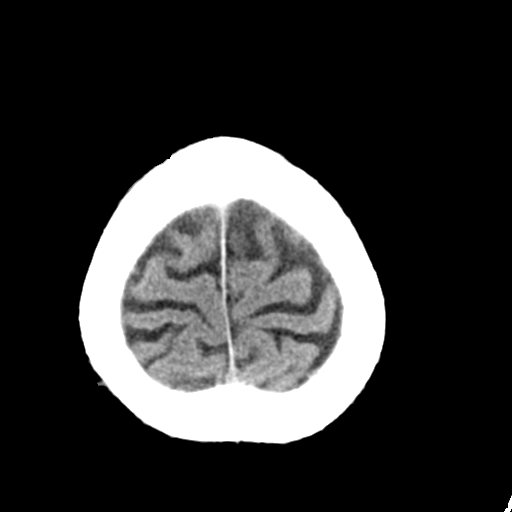
[im 28/31  brain]
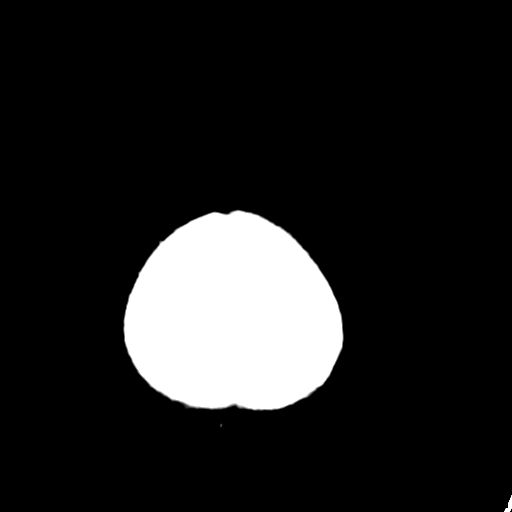
[im 28/31  bone]
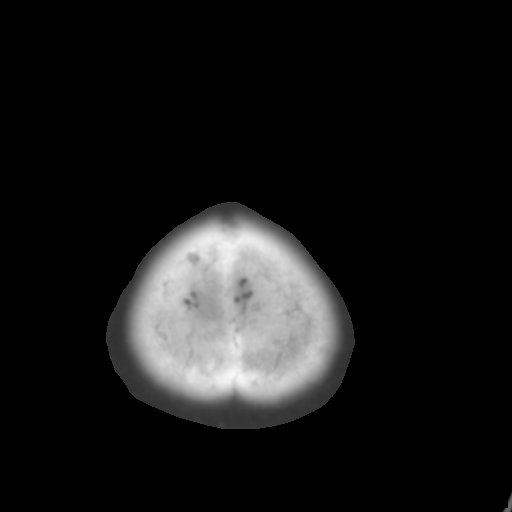

[Series 4: head 3.0 mpr · coronal · 0.30mm/px · 3 of 65 slices shown (1 of 2)]
[im 22/65  brain]
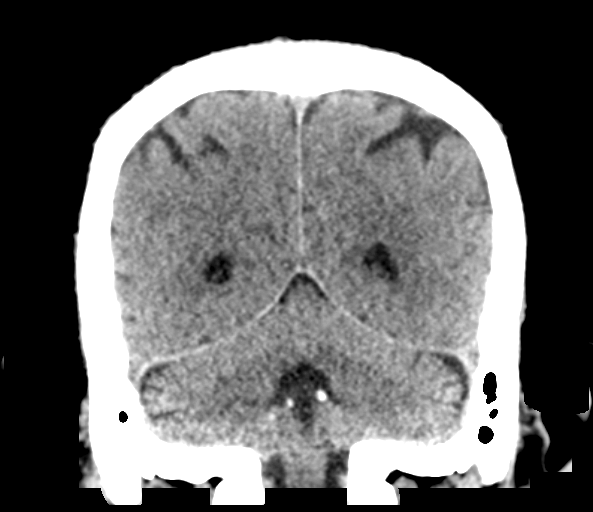
[im 29/65  brain]
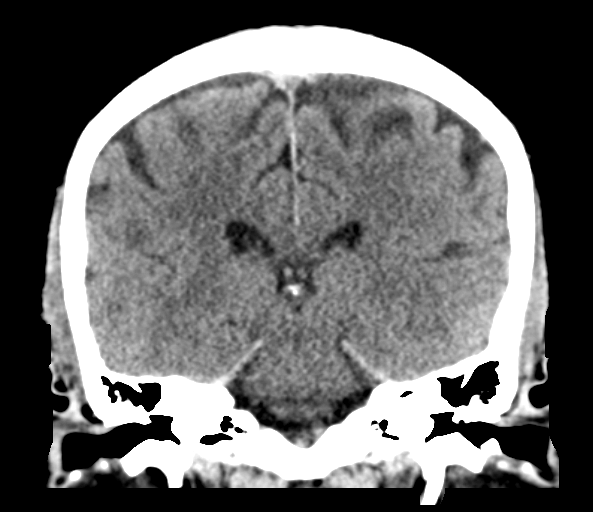
[im 36/65  brain]
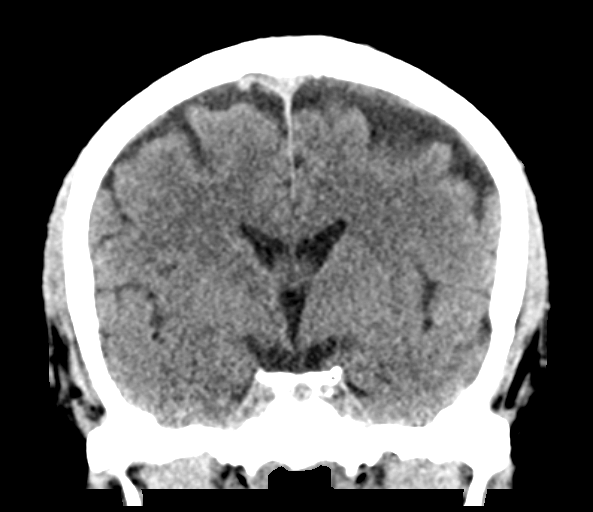

[Series 5: head 3.0 mpr · sagittal · 0.30mm/px · 3 of 59 slices shown (2 of 2)]
[im 20/59  brain]
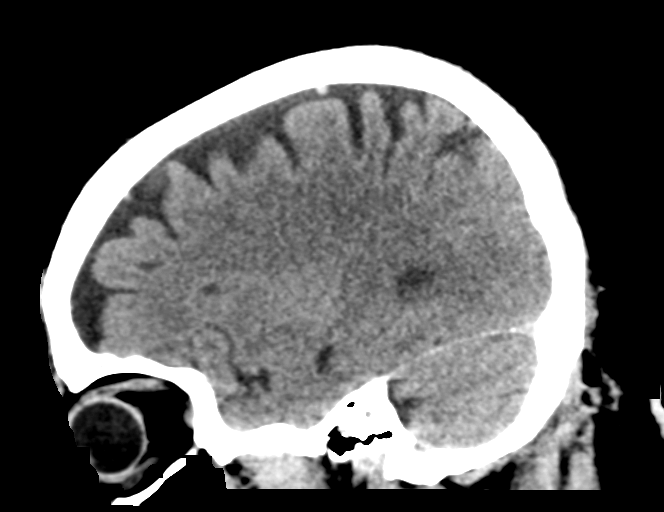
[im 30/59  brain]
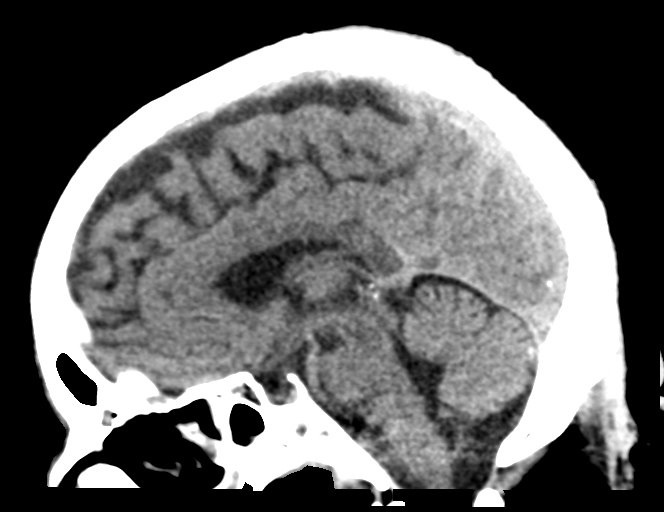
[im 39/59  brain]
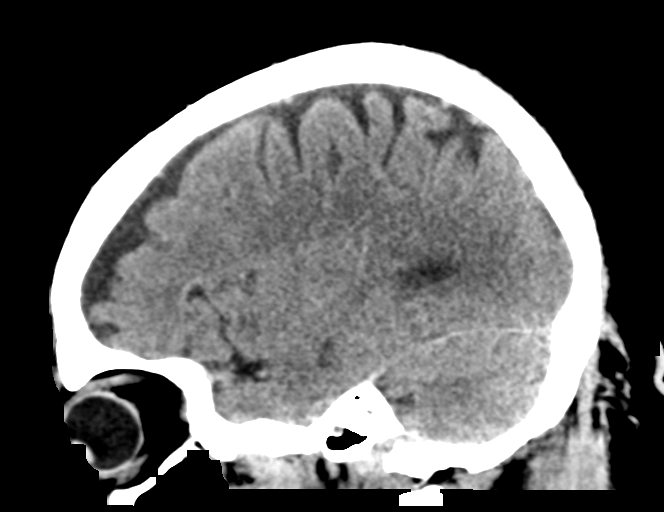

[15 of 47 positions shown; findings below may reference images not displayed]

FINDINGS: Brain: No evidence of acute infarction, hemorrhage, hydrocephalus,
extra-axial collection or mass lesion/mass effect. There is mild
bilateral frontal lobe atrophy.

Vascular: No hyperdense vessel or unexpected calcification.

Skull: No osseous abnormality.

Sinuses/Orbits: New bilateral maxillary sinus and ethmoid sinus
mucosal thickening. The Visualized mastoid sinuses are clear.
Visualized orbits demonstrate no focal abnormality.

Other: None
IMPRESSION: 1. No acute intracranial pathology.

## 2018-10-22 ENCOUNTER — Other Ambulatory Visit: Payer: Self-pay

## 2018-10-22 ENCOUNTER — Ambulatory Visit: Payer: Medicare Other | Admitting: Family Medicine

## 2018-10-22 VITALS — BP 152/72 | HR 76

## 2018-10-22 DIAGNOSIS — I10 Essential (primary) hypertension: Secondary | ICD-10-CM | POA: Diagnosis not present

## 2018-10-22 MED ORDER — AMLODIPINE BESYLATE 5 MG PO TABS: 5.0000 mg | ORAL_TABLET | Freq: Every day | ORAL | 0 refills | Status: DC

## 2018-10-22 NOTE — Patient Instructions (Signed)
It was great meeting you today!  Your blood pressure is a little high has been for little bit, as discussed is likely to you not been working as much.  I think starting a medication called amlodipine at 5 mg daily good idea.  I like to see you back in about a month to check your blood pressure.  This visit can be with either myself or Dr. Criss Rosales.

## 2018-10-22 NOTE — Assessment & Plan Note (Signed)
bp 152/72 in clinic. After discussion with patient came to mutual decision to start amlodipine 5mg  daily. Also discussed not starting a medication and having patient come back in 1 month after introducing cardiovascular exercise. Patient opted for amlodipine. - amlodipine 5mg  daily - f/u in 1 month

## 2018-10-22 NOTE — Progress Notes (Signed)
   HPI 68 year old male who presents for hypertension. States that he has been checking his blood pressures at home and have noticed that they have been elevated. Patient has not been taking any blood pressure medications. He attributes his high blood pressures to not being able to work out recently. He states that prior to covid-19 pandemic he was working out 5 times per week for an hour. He does not have enough room at home, and does not like running/biking.  Of note patient has been on hctz and metoprolol in the past. He says that he thinks he had some increased urination from the hctz.   CC: hypertension   ROS:   Review of Systems See HPI for ROS.   CC, SH/smoking status, and VS noted  Objective: BP (!) 152/72   Pulse 76   SpO2 68%  Gen: 68 year old, AA male, no acute distress CV: RRR, no murmur Resp: CTAB, no wheezes, non-labored Neuro: Alert and oriented, Speech clear, No gross deficits   Assessment and plan:  Essential hypertension, benign bp 152/72 in clinic. After discussion with patient came to mutual decision to start amlodipine 5mg  daily. Also discussed not starting a medication and having patient come back in 1 month after introducing cardiovascular exercise. Patient opted for amlodipine. - amlodipine 5mg  daily - f/u in 1 month   No orders of the defined types were placed in this encounter.   Meds ordered this encounter  Medications  . amLODipine (NORVASC) 5 MG tablet    Sig: Take 1 tablet (5 mg total) by mouth at bedtime.    Dispense:  45 tablet    Refill:  0     Guadalupe Dawn MD PGY-2 Family Medicine Resident  10/22/2018 12:29 PM

## 2018-11-28 ENCOUNTER — Other Ambulatory Visit: Payer: Self-pay | Admitting: Family Medicine

## 2018-11-30 ENCOUNTER — Telehealth: Payer: Self-pay | Admitting: *Deleted

## 2018-11-30 NOTE — Telephone Encounter (Signed)
-----   Message from Sherene Sires, DO sent at 11/30/2018 10:17 AM EDT ----- Will refill patient's new BP meds but would like to have at least telemed f/u with them if they are able to check Bps at home.  If not, would like in person.  -Dr. bland

## 2018-12-01 NOTE — Telephone Encounter (Signed)
Contacted pt and informed him of below and he is able to check BP at home but will have to call back to schedule due to being out of town. Amy Belloso Zimmerman Rumple, CMA

## 2019-01-02 ENCOUNTER — Emergency Department (HOSPITAL_COMMUNITY)
Admission: EM | Admit: 2019-01-02 | Discharge: 2019-01-02 | Disposition: A | Discharge status: Home / Self Care | Payer: Medicare Other | Attending: Emergency Medicine | Admitting: Emergency Medicine

## 2019-01-02 ENCOUNTER — Encounter (HOSPITAL_COMMUNITY): Payer: Self-pay

## 2019-01-02 ENCOUNTER — Other Ambulatory Visit: Payer: Self-pay

## 2019-01-02 DIAGNOSIS — R109 Unspecified abdominal pain: Secondary | ICD-10-CM | POA: Diagnosis present

## 2019-01-02 DIAGNOSIS — Z5321 Procedure and treatment not carried out due to patient leaving prior to being seen by health care provider: Secondary | ICD-10-CM | POA: Insufficient documentation

## 2019-01-02 MED ORDER — SODIUM CHLORIDE 0.9% FLUSH: 3.0000 mL | Freq: Once | INTRAVENOUS | Status: DC

## 2019-01-02 NOTE — ED Triage Notes (Signed)
Pt BIB [facility] from home. Complaining of suprapubic abdominal pain that started around 4p yesterday. Endorses 3 episodes of vomiting today. EMTs witness one episode and described vomit as yellow. A&Ox4. Ambulatory.

## 2019-01-02 NOTE — ED Notes (Signed)
Called for blood draw, no answer.

## 2019-01-09 ENCOUNTER — Other Ambulatory Visit: Payer: Self-pay | Admitting: Family Medicine

## 2019-02-17 ENCOUNTER — Other Ambulatory Visit: Payer: Self-pay | Admitting: Family Medicine

## 2019-02-20 ENCOUNTER — Other Ambulatory Visit: Payer: Self-pay | Admitting: Family Medicine

## 2019-02-20 DIAGNOSIS — E785 Hyperlipidemia, unspecified: Secondary | ICD-10-CM

## 2019-02-20 DIAGNOSIS — I10 Essential (primary) hypertension: Secondary | ICD-10-CM

## 2019-02-22 NOTE — Telephone Encounter (Signed)
Please call patient, I have refilled their atorvastatin but they need to come in for annual lab work and appt with one of our physicians.  This can be scheduled with any doctor as I will be out of town until end of Nov.  -Dr. Criss Rosales

## 2019-02-23 NOTE — Telephone Encounter (Signed)
LVM to call office to inform pt of below and assist him in getting an appointment scheduled.Alicia Ackert Zimmerman Rumple, CMA

## 2019-02-25 NOTE — Telephone Encounter (Signed)
Pt returned call.  Appt made. Christen Bame, CMA

## 2019-03-15 ENCOUNTER — Encounter: Payer: Self-pay | Admitting: Family Medicine

## 2019-03-15 ENCOUNTER — Ambulatory Visit (INDEPENDENT_AMBULATORY_CARE_PROVIDER_SITE_OTHER): Payer: Medicare Other | Admitting: Family Medicine

## 2019-03-15 ENCOUNTER — Other Ambulatory Visit: Payer: Self-pay

## 2019-03-15 VITALS — BP 134/72 | HR 63 | Wt 193.0 lb

## 2019-03-15 DIAGNOSIS — Z789 Other specified health status: Secondary | ICD-10-CM | POA: Diagnosis not present

## 2019-03-15 DIAGNOSIS — E785 Hyperlipidemia, unspecified: Secondary | ICD-10-CM

## 2019-03-15 DIAGNOSIS — Z0000 Encounter for general adult medical examination without abnormal findings: Secondary | ICD-10-CM

## 2019-03-15 DIAGNOSIS — I10 Essential (primary) hypertension: Secondary | ICD-10-CM

## 2019-03-15 NOTE — Patient Instructions (Signed)
Mr. John Compton,  It was lovely to meet you today! I am pleased to see that you are managing your high blood pressure very well with the amlodipine and your great lifestyle. Today we discussed continuing your blood pressure medication at the same dose of 5 mg as your BP was 134/72.  Also discussed making sure that you have a adequate caloric intake as you are working out 4 hours a day at the gym. Take a snack to the gym and have it during the middle of your work out.  I have referred you for a colonoscopy as her last one was 19 years ago.  You should hear from the GI clinic soon.  I have also ordered a cholesterol check today.  You should see Dr. Criss Rosales when he is back to discuss coming off the statin medication in the future.  We will continue the statin for today.  Keep up the great work!  Wish you the best,  Dr. Posey Pronto

## 2019-03-15 NOTE — Assessment & Plan Note (Signed)
BP at goal today. Continue 5 mg amlodipine daily.

## 2019-03-15 NOTE — Assessment & Plan Note (Signed)
Patient is extremely fit for his age and enjoys working out.  I recommended that he has an adequate caloric intake prior to working out and doing his long workouts to avoid hypoglycemic sx.

## 2019-03-15 NOTE — Progress Notes (Signed)
   Subjective:    Patient ID: John Compton, male    DOB: Jan 07, 1951, 68 y.o.   MRN: [id number]   CC: John Compton is a 68 year old male who presents today for hypertension follow-up  HPI:  Patient has no concerns today and thought he was going to get labs  Exercise Prior to COVID-19 patient worked out 5 days a week and has recently restarted going to the gym.  Over the last 3 weeks has started working out 4 hrs a day/5 days a week in the gym which consists of 1 hour of cardio, 30 minutes bike and the remaining time doing weights and crunches. He leads a very healthy lifestyle and is determined to wean off the statin he is taking.  Reports eating oatmeal prior to the gym and sometimes a granola bar in the middle of his workouts.  After his work outs he feels extremely hungry and usually eats a salad with chicken.  Patient has always enjoyed exercising and looking after his health.  Chronic HTN Medications: Amlodipine  Side effects: no  Home BP Monitoring - no Chest pain- no    Dyspnea- no Headache - no  Compliance-  yes      Lightheadedness-  yes              Edema- no   Preventitive Healthcare:  Exercise: yes, see above  Diet Pattern: salad, baked chicken, fish, not greasy foods. Carbs: oats and sweet potato.  Salt Restriction: minimal   Smoking status reviewed   ROS: pertinent noted in the HPI    Past medical history, surgical, family, and social history reviewed and updated in the EMR as appropriate. Reviewed problem list.   Objective:  BP 134/72   Pulse 63   Wt 193 lb (87.5 kg)   SpO2 97%   BMI 32.12 kg/m   Vitals and nursing note reviewed  General: NAD, pleasant, able to participate in exam Cardiac: RRR, S1 S2 present. normal heart sounds, no murmurs. Respiratory: CTAB, normal effort, No wheezes, rales or rhonchi Extremities: no edema or cyanosis. Skin: warm and dry, no rashes noted Neuro: alert, no obvious focal deficits Psych: Normal affect and mood   Assessment  & Plan:    Essential hypertension, benign BP at goal today. Continue 5 mg amlodipine daily.  Hyperlipidemia Patient leads a very healthy lifestyle and strong minded about weaning off the statin. Have ordered a repeat LDL level today, if within normal limits could consider weaning off statin per PCP.  Healthcare maintenance Last colonoscopy in 2001.  I have referred to GI for colonoscopy screening.  Heavy exercise Patient is extremely fit for his age and enjoys working out.  I recommended that he has an adequate caloric intake prior to working out and doing his long workouts to avoid hypoglycemic sx.   John Haw, MD  [place] PGY-1

## 2019-03-15 NOTE — Assessment & Plan Note (Signed)
Patient leads a very healthy lifestyle and strong minded about weaning off the statin. Have ordered a repeat LDL level today, if within normal limits could consider weaning off statin per PCP.

## 2019-03-15 NOTE — Assessment & Plan Note (Addendum)
Last colonoscopy in 2001.  I have referred to GI for colonoscopy screening.

## 2019-03-16 LAB — BASIC METABOLIC PANEL
BUN/Creatinine Ratio: 12 (ref 10–24)
BUN: 12 mg/dL (ref 8–27)
CO2: 24 mmol/L (ref 20–29)
Calcium: 9.6 mg/dL (ref 8.6–10.2)
Chloride: 103 mmol/L (ref 96–106)
Creatinine, Ser: 1.02 mg/dL (ref 0.76–1.27)
GFR calc Af Amer: 86 mL/min/{1.73_m2} (ref >59)
GFR calc non Af Amer: 75 mL/min/{1.73_m2} (ref >59)
Glucose: 94 mg/dL (ref 65–99)
Potassium: 4.1 mmol/L (ref 3.5–5.2)
Sodium: 140 mmol/L (ref 134–144)

## 2019-03-16 LAB — LDL CHOLESTEROL, DIRECT: LDL Direct: 60 mg/dL (ref 0–99)

## 2019-03-23 ENCOUNTER — Telehealth: Payer: Self-pay | Admitting: Family Medicine

## 2019-03-23 NOTE — Telephone Encounter (Signed)
Called pt to inform him that lab results from clinic visit last week, unable to reach so left voicemail. All labs were normal including normal LDL. Asked pt to see Dr Criss Rosales regarding weaning off statin. I will inform Dr Criss Rosales of these results.

## 2019-04-06 ENCOUNTER — Other Ambulatory Visit: Payer: Self-pay | Admitting: Family Medicine

## 2019-06-25 ENCOUNTER — Other Ambulatory Visit: Payer: Self-pay | Admitting: Family Medicine

## 2019-06-25 DIAGNOSIS — E785 Hyperlipidemia, unspecified: Secondary | ICD-10-CM

## 2019-06-28 ENCOUNTER — Ambulatory Visit: Payer: Medicare Other | Attending: Internal Medicine

## 2019-06-28 DIAGNOSIS — Z23 Encounter for immunization: Secondary | ICD-10-CM | POA: Insufficient documentation

## 2019-06-28 NOTE — Progress Notes (Signed)
   Covid-19 Vaccination Clinic  Name:  [name]    MRN: [id number] DOB: 04-11-1951  06/28/2019  [name] was observed post Covid-19 immunization for 15 minutes without incidence. He was provided with Vaccine Information Sheet and instruction to access the V-Safe system.   [name] was instructed to call 911 with any severe reactions post vaccine: Marland Kitchen Difficulty breathing  . Swelling of your face and throat  . A fast heartbeat  . A bad rash all over your body  . Dizziness and weakness    Immunizations Administered    Name Date Dose VIS Date Route   Pfizer COVID-19 Vaccine 06/28/2019 11:48 AM 0.3 mL 04/09/2019 Intramuscular   Manufacturer: [place]   Lot: [id number]   [place]: [id number]

## 2019-07-12 ENCOUNTER — Encounter: Payer: Self-pay | Admitting: Family Medicine

## 2019-07-12 ENCOUNTER — Ambulatory Visit (INDEPENDENT_AMBULATORY_CARE_PROVIDER_SITE_OTHER): Payer: Medicare Other | Admitting: Family Medicine

## 2019-07-12 VITALS — Ht 65.0 in | Wt 180.0 lb

## 2019-07-12 DIAGNOSIS — Z0000 Encounter for general adult medical examination without abnormal findings: Secondary | ICD-10-CM
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
## 2019-07-27 ENCOUNTER — Ambulatory Visit: Payer: Medicare Other | Attending: Internal Medicine

## 2019-07-27 DIAGNOSIS — Z23 Encounter for immunization: Secondary | ICD-10-CM

## 2019-07-27 NOTE — Progress Notes (Signed)
   Covid-19 Vaccination Clinic  Name:  Von Lownes    MRN: [id number] DOB: [birth year]  07/27/2019  Mr. Zelazny was observed post Covid-19 immunization for 15 minutes without incident. He was provided with Vaccine Information Sheet and instruction to access the V-Safe system.   Mr. Vradenburg was instructed to call 911 with any severe reactions post vaccine: Marland Kitchen Difficulty breathing  . Swelling of face and throat  . A fast heartbeat  . A bad rash all over body  . Dizziness and weakness   Immunizations Administered    Name Date Dose VIS Date Route   Pfizer COVID-19 Vaccine 07/27/2019 11:55 AM 0.3 mL 04/09/2019 Intramuscular   Manufacturer: [place]   Lot: [id number]   [place]: [id number]

## 2019-10-06 ENCOUNTER — Other Ambulatory Visit: Payer: Self-pay | Admitting: Family Medicine

## 2020-02-22 ENCOUNTER — Ambulatory Visit: Payer: Medicare Other | Attending: Internal Medicine

## 2020-02-22 DIAGNOSIS — Z23 Encounter for immunization: Secondary | ICD-10-CM

## 2020-02-22 NOTE — Progress Notes (Signed)
   Covid-19 Vaccination Clinic  Name:  Ryheem Jay    MRN: [id number] DOB: 04/26/1951  02/22/2020  Mr. Walraven was observed post Covid-19 immunization for 15 minutes without incident. He was provided with Vaccine Information Sheet and instruction to access the V-Safe system.   Mr. Vanpatten was instructed to call 911 with any severe reactions post vaccine: Marland Kitchen Difficulty breathing  . Swelling of face and throat  . A fast heartbeat  . A bad rash all over body  . Dizziness and weakness

## 2020-02-23 ENCOUNTER — Other Ambulatory Visit: Payer: Self-pay | Admitting: *Deleted

## 2020-02-23 DIAGNOSIS — E785 Hyperlipidemia, unspecified: Secondary | ICD-10-CM

## 2020-02-23 MED ORDER — ATORVASTATIN CALCIUM 20 MG PO TABS: 20.0000 mg | ORAL_TABLET | Freq: Every day | ORAL | 3 refills | Status: DC

## 2020-02-28 ENCOUNTER — Other Ambulatory Visit: Payer: Self-pay | Admitting: Family Medicine

## 2020-02-28 DIAGNOSIS — E785 Hyperlipidemia, unspecified: Secondary | ICD-10-CM

## 2020-05-21 NOTE — Progress Notes (Signed)
    SUBJECTIVE:   CHIEF COMPLAINT / HPI: Check blood pressure and requesting referral for colonoscopy.  No acute concerns today.  Just requesting physical.  Works out gym 5 days a week.    HTN Asymptomatic.  Denies any chest pain,headaches or visual disturbances. Compliant with medications.   PERTINENT  PMH / PSH:  HTN   OBJECTIVE:   BP 122/70   Pulse 80   Ht 5\' 5"  (1.651 m)   Wt 202 lb 9.6 oz (91.9 kg)   SpO2 97%   BMI 33.71 kg/m    General: Alert, no acute distress Cardio: Normal S1 and S2, RRR, no r/m/g Pulm: CTAB, normal work of breathing Abdomen: Bowel sounds normal. Abdomen soft and non-tender.  Extremities: No peripheral edema.  Neuro: Cranial nerves grossly intact   ASSESSMENT/PLAN:   Essential hypertension, benign BP goal today. Bmet today. Decrease amlodipine 2.5 mg daily.  If remains at 120's can discontinue after 1-2 weeks, If increases to 130's can go back to 5 mg daily. Follow-up 2-4 weeks or sooner if needed  Hyperlipidemia Patient is healthy lifestyle. Last LDL 53 in 2019. Compliant with atorvastatin 20 mg daily Repeat lipids today. Continue current treatment. Follow-up 6/12.   Healthcare maintenance Covid vaccine: Fully vaccinated Flu vaccine: today Colonoscopy: ordered today      Carollee Leitz, MD [place]

## 2020-05-21 NOTE — Patient Instructions (Addendum)
Thank you for coming to see me today. It was a pleasure.   You are due for a colonoscopy.  Please use the form that we have given you to schedule this at your convenience.   We will get some labs today.  If they are abnormal or we need to do something about them, I will call you.  If they are normal, I will send you a message on MyChart (if it is active) or a letter in the mail.  If you don't hear from [place] in 2 weeks, please call the office at the number below.  Decrease amlodipine to 2.5 mg daily for 1 week.  If your blood pressure stays around 120/70 continue on the 2.5 mg daily.  If your blood pressure goes above 120/70 go back to taking 5 mg daily.  Please get a copy of your Tetanus vaccine from [facility] and let us know so we can update your records.  Please follow-up with PCP as needed  If you have any questions or concerns, please do not hesitate to call the office at [phone].  Best,   Carollee Leitz, MD

## 2020-05-22 ENCOUNTER — Ambulatory Visit: Payer: Medicare Other | Admitting: Family Medicine

## 2020-05-22 ENCOUNTER — Other Ambulatory Visit: Payer: Self-pay

## 2020-05-22 VITALS — BP 122/70 | HR 80 | Ht 65.0 in | Wt 202.6 lb

## 2020-05-22 DIAGNOSIS — E785 Hyperlipidemia, unspecified: Secondary | ICD-10-CM

## 2020-05-22 DIAGNOSIS — Z0000 Encounter for general adult medical examination without abnormal findings: Secondary | ICD-10-CM | POA: Diagnosis not present

## 2020-05-22 DIAGNOSIS — Z23 Encounter for immunization: Secondary | ICD-10-CM

## 2020-05-22 DIAGNOSIS — Z1211 Encounter for screening for malignant neoplasm of colon: Secondary | ICD-10-CM | POA: Diagnosis not present

## 2020-05-22 DIAGNOSIS — I10 Essential (primary) hypertension: Secondary | ICD-10-CM | POA: Diagnosis not present

## 2020-05-22 LAB — POCT GLYCOSYLATED HEMOGLOBIN (HGB A1C): Hemoglobin A1C: 5.8 % — AB (ref 4.0–5.6)

## 2020-05-22 NOTE — Assessment & Plan Note (Addendum)
Covid vaccine: Fully vaccinated Flu vaccine: today Colonoscopy: ordered today

## 2020-05-22 NOTE — Assessment & Plan Note (Signed)
Patient is healthy lifestyle. Last LDL 53 in 2019. Compliant with atorvastatin 20 mg daily Repeat lipids today. Continue current treatment. Follow-up 6/12.

## 2020-05-22 NOTE — Assessment & Plan Note (Signed)
BP goal today. Bmet today. Decrease amlodipine 2.5 mg daily.  If remains at 120's can discontinue after 1-2 weeks, If increases to 130's can go back to 5 mg daily. Follow-up 2-4 weeks or sooner if needed

## 2020-05-23 ENCOUNTER — Encounter: Payer: Self-pay | Admitting: Family Medicine

## 2020-05-23 LAB — LIPID PANEL
Chol/HDL Ratio: 2.6 ratio (ref 0.0–5.0)
Cholesterol, Total: 128 mg/dL (ref 100–199)
HDL: 49 mg/dL (ref >39)
LDL Chol Calc (NIH): 68 mg/dL (ref 0–99)
Triglycerides: 48 mg/dL (ref 0–149)
VLDL Cholesterol Cal: 11 mg/dL (ref 5–40)

## 2020-05-23 LAB — BASIC METABOLIC PANEL
BUN/Creatinine Ratio: 9 — ABNORMAL LOW (ref 10–24)
BUN: 10 mg/dL (ref 8–27)
CO2: 23 mmol/L (ref 20–29)
Calcium: 9.6 mg/dL (ref 8.6–10.2)
Chloride: 102 mmol/L (ref 96–106)
Creatinine, Ser: 1.14 mg/dL (ref 0.76–1.27)
GFR calc Af Amer: 74 mL/min/{1.73_m2} (ref >59)
GFR calc non Af Amer: 64 mL/min/{1.73_m2} (ref >59)
Glucose: 78 mg/dL (ref 65–99)
Potassium: 4.2 mmol/L (ref 3.5–5.2)
Sodium: 140 mmol/L (ref 134–144)

## 2020-05-23 MED ORDER — AMLODIPINE BESYLATE 2.5 MG PO TABS: 2.5000 mg | ORAL_TABLET | Freq: Every day | ORAL | 3 refills | Status: DC

## 2020-06-09 ENCOUNTER — Encounter: Payer: Self-pay | Admitting: Gastroenterology

## 2020-06-28 ENCOUNTER — Ambulatory Visit (AMBULATORY_SURGERY_CENTER): Payer: Self-pay

## 2020-06-28 ENCOUNTER — Other Ambulatory Visit: Payer: Self-pay

## 2020-06-28 VITALS — Ht 65.0 in | Wt 196.0 lb

## 2020-06-28 DIAGNOSIS — Z1211 Encounter for screening for malignant neoplasm of colon: Secondary | ICD-10-CM

## 2020-06-28 DIAGNOSIS — Z800 Family history of malignant neoplasm of digestive organs: Secondary | ICD-10-CM

## 2020-06-28 MED ORDER — SUTAB 1479-225-188 MG PO TABS: 1.0000 | ORAL_TABLET | ORAL | 0 refills | Status: DC

## 2020-06-28 NOTE — Progress Notes (Signed)
No egg or soy allergy known to patient  No issues with past sedation with any surgeries or procedures No intubation problems in the past  No FH of Malignant Hyperthermia No diet pills per patient No home 02 use per patient  No blood thinners per patient  Pt denies issues with constipation  No A fib or A flutter  COVID 19 guidelines implemented in PV today with Pt and RN  Pt denies loose teeth, dentures, partials, dental implants, capped or bonded teeth; patient reports missing teeth,  Coupon given to pt in PV today , Code to Pharmacy and  NO PA's for preps discussed with pt in PV today  Discussed with pt there will be an out-of-pocket cost for prep and that varies from $0 to 70 dollars  Due to the COVID-19 pandemic we are asking patients to follow certain guidelines. Pt aware of COVID protocols and [facility] guidelines

## 2020-07-03 ENCOUNTER — Encounter: Payer: Self-pay | Admitting: Gastroenterology

## 2020-07-14 ENCOUNTER — Ambulatory Visit (AMBULATORY_SURGERY_CENTER): Payer: Medicare Other | Admitting: Gastroenterology

## 2020-07-14 ENCOUNTER — Telehealth: Payer: Self-pay | Admitting: Gastroenterology

## 2020-07-14 ENCOUNTER — Other Ambulatory Visit: Payer: Self-pay

## 2020-07-14 ENCOUNTER — Encounter: Payer: Self-pay | Admitting: Gastroenterology

## 2020-07-14 VITALS — BP 92/55 | HR 80 | Temp 98.3°F | Resp 20 | Ht 65.0 in | Wt 196.0 lb

## 2020-07-14 DIAGNOSIS — D122 Benign neoplasm of ascending colon: Secondary | ICD-10-CM

## 2020-07-14 DIAGNOSIS — D123 Benign neoplasm of transverse colon: Secondary | ICD-10-CM | POA: Diagnosis not present

## 2020-07-14 DIAGNOSIS — Z1211 Encounter for screening for malignant neoplasm of colon: Secondary | ICD-10-CM

## 2020-07-14 DIAGNOSIS — D120 Benign neoplasm of cecum: Secondary | ICD-10-CM | POA: Diagnosis not present

## 2020-07-14 MED ORDER — SODIUM CHLORIDE 0.9 % IV SOLN: 500.0000 mL | Freq: Once | INTRAVENOUS | Status: DC

## 2020-07-14 NOTE — Progress Notes (Signed)
Pt's states no medical or surgical changes since previsit or office visit. VS by CW. 

## 2020-07-14 NOTE — Progress Notes (Signed)
To PACU, VSS. Report to [email or web address] 

## 2020-07-14 NOTE — Progress Notes (Signed)
Called to room to assist during endoscopic procedure.  Patient ID and intended procedure confirmed with present staff. Received instructions for my participation in the procedure from the performing physician.  

## 2020-07-14 NOTE — Patient Instructions (Signed)
YOU HAD AN ENDOSCOPIC PROCEDURE TODAY AT THE [facility]:   Refer to the procedure report that was given to you for any specific questions about what was found during the examination.  If the procedure report does not answer your questions, please call your gastroenterologist to clarify.  If you requested that your care partner not be given the details of your procedure findings, then the procedure report has been included in a sealed envelope for you to review at your convenience later.  YOU SHOULD EXPECT: Some feelings of bloating in the abdomen. Passage of more gas than usual.  Walking can help get rid of the air that was put into your GI tract during the procedure and reduce the bloating. If you had a lower endoscopy (such as a colonoscopy or flexible sigmoidoscopy) you may notice spotting of blood in your stool or on the toilet paper. If you underwent a bowel prep for your procedure, you may not have a normal bowel movement for a few days.  Please Note:  You might notice some irritation and congestion in your nose or some drainage.  This is from the oxygen used during your procedure.  There is no need for concern and it should clear up in a day or so.  SYMPTOMS TO REPORT IMMEDIATELY:   Following lower endoscopy (colonoscopy or flexible sigmoidoscopy):  Excessive amounts of blood in the stool  Significant tenderness or worsening of abdominal pains  Swelling of the abdomen that is new, acute  Fever of 100F or higher   Following upper endoscopy (EGD)  Vomiting of blood or coffee ground material  New chest pain or pain under the shoulder blades  Painful or persistently difficult swallowing  New shortness of breath  Fever of 100F or higher  Black, tarry-looking stools  For urgent or emergent issues, a gastroenterologist can be reached at any hour by calling [phone]. Do not use MyChart messaging for urgent concerns.    DIET:  We do recommend a small meal at first, but  then you may proceed to your regular diet.  Drink plenty of fluids but you should avoid alcoholic beverages for 24 hours.  ACTIVITY:  You should plan to take it easy for the rest of today and you should NOT DRIVE or use heavy machinery until tomorrow (because of the sedation medicines used during the test).    FOLLOW UP: Our staff will call the number listed on your records 48-72 hours following your procedure to check on you and address any questions or concerns that you may have regarding the information given to you following your procedure. If we do not reach you, we will leave a message.  We will attempt to reach you two times.  During this call, we will ask if you have developed any symptoms of COVID 19. If you develop any symptoms (ie: fever, flu-like symptoms, shortness of breath, cough etc.) before then, please call [phone].  If you test positive for Covid 19 in the 2 weeks post procedure, please call and report this information to us.    If any biopsies were taken you will be contacted by phone or by letter within the next 1-3 weeks.  Please call us at [phone] if you have not heard about the biopsies in 3 weeks.    SIGNATURES/CONFIDENTIALITY: You and/or your care partner have signed paperwork which will be entered into your electronic medical record.  These signatures attest to the fact that that the information above on   your After Visit Summary has been reviewed and is understood.  Full responsibility of the confidentiality of this discharge information lies with you and/or your care-partner. 

## 2020-07-14 NOTE — Telephone Encounter (Signed)
Pts wife reports pt having chills since he got home.  He does not have a fever and is having no other symptoms at this time.  He has been able to eat and has been drinking warm fluids.  Advised wife that pts have this reaction while taking the prep and it may be that same reaction.  Encouraged her to have him continue to drink warm fluids and to call back if any new symptoms arise.  She verbalized understanding.

## 2020-07-14 NOTE — Op Note (Addendum)
[facility] Patient Name: John Compton Procedure Date: 07/14/2020 9:14 AM MRN: [id number] Endoscopist: Remo Lipps P. Havery Moros , MD Age: 70 Referring MD:  Date of Birth: February 14, 1951 Gender: Male Account #: [id number] Procedure:                Colonoscopy Indications:              Screening for colorectal malignant neoplasm Medicines:                Monitored Anesthesia Care Procedure:                Pre-Anesthesia Assessment:                           - Prior to the procedure, a History and Physical                            was performed, and patient medications and                            allergies were reviewed. The patient's tolerance of                            previous anesthesia was also reviewed. The risks                            and benefits of the procedure and the sedation                            options and risks were discussed with the patient.                            All questions were answered, and informed consent                            was obtained. Prior Anticoagulants: The patient has                            taken no previous anticoagulant or antiplatelet                            agents. ASA Grade Assessment: II - A patient with                            mild systemic disease. After reviewing the risks                            and benefits, the patient was deemed in                            satisfactory condition to undergo the procedure.                           After obtaining informed consent, the colonoscope  was passed under direct vision. Throughout the                            procedure, the patient's blood pressure, pulse, and                            oxygen saturations were monitored continuously. The                            Colonoscope was introduced through the anus and                            advanced to the the cecum, identified by                            appendiceal orifice and  ileocecal valve. The                            colonoscopy was technically difficult and complex                            due to a redundant colon. The patient tolerated the                            procedure well. The quality of the bowel                            preparation was good. The ileocecal valve,                            appendiceal orifice, and rectum were photographed. Scope In: 9:22:53 AM Scope Out: 10:01:43 AM Scope Withdrawal Time: 0 hours 15 minutes 43 seconds  Total Procedure Duration: 0 hours 38 minutes 50 seconds  Findings:                 The perianal and digital rectal examinations were                            normal.                           The colon was redundant with severe looping. Cecal                            intubation was quite challenging. Multiple                            positional changes and abdominal pressure were                            needed to achieve cecal intubation.                           Four sessile polyps were found in the cecum. The  polyps were 3 to 4 mm in size. These polyps were                            removed with a cold snare. Resection and retrieval                            were complete.                           A 4 mm polyp was found in the ascending colon. The                            polyp was sessile. The polyp was removed with a                            cold snare. Resection and retrieval were complete.                           A 3 mm polyp was found in the hepatic flexure. The                            polyp was sessile. The polyp was removed with a                            cold snare. Resection and retrieval were complete.                           Four sessile polyps were found in the transverse                            colon. The polyps were 3 to 4 mm in size. These                            polyps were removed with a cold snare. Resection                             and retrieval were complete.                           Internal hemorrhoids were found during retroflexion.                           The exam was otherwise without abnormality. Complications:            No immediate complications. Estimated blood loss:                            Minimal. Estimated Blood Loss:     Estimated blood loss was minimal. Impression:               - Redundant colon with significant looping.                           - Four 3  to 4 mm polyps in the cecum, removed with                            a cold snare. Resected and retrieved.                           - One 4 mm polyp in the ascending colon, removed                            with a cold snare. Resected and retrieved.                           - One 3 mm polyp at the hepatic flexure, removed                            with a cold snare. Resected and retrieved.                           - Four 3 to 4 mm polyps in the transverse colon,                            removed with a cold snare. Resected and retrieved.                           - Internal hemorrhoids.                           - The examination was otherwise normal. Recommendation:           - Patient has a contact number available for                            emergencies. The signs and symptoms of potential                            delayed complications were discussed with the                            patient. Return to normal activities tomorrow.                            Written discharge instructions were provided to the                            patient.                           - Resume previous diet.                           - Continue present medications.                           - Await pathology results. Remo Lipps P. Shirley Bolle, MD 07/14/2020 10:06:16 AM This report has been signed electronically.

## 2020-07-14 NOTE — Telephone Encounter (Signed)
Pt's caretaker is requesting a call back from a nurse, caller states the pt had a colonoscopy done but is experiencing chills, caller would like to know if this is normal

## 2020-07-18 ENCOUNTER — Telehealth: Payer: Self-pay

## 2020-07-18 NOTE — Telephone Encounter (Signed)
  Follow up Call-  Call back number 07/14/2020  Post procedure Call Back phone  # [phone]  Permission to leave phone message Yes  Some recent data might be hidden     Patient questions:  Do you have a fever, pain , or abdominal swelling? No. Pain Score  0 *  Have you tolerated food without any problems? Yes.    Have you been able to return to your normal activities? Yes.    Do you have any questions about your discharge instructions: Diet   No. Medications  No. Follow up visit  No.  Do you have questions or concerns about your Care? No.  Actions: * If pain score is 4 or above: No action needed, pain <4.   1. Have you developed a fever since your procedure? No   2.   Have you had an respiratory symptoms (SOB or cough) since your procedure? No   3.   Have you tested positive for COVID 19 since your procedure? No   4.   Have you had any family members/close contacts diagnosed with the COVID 19 since your procedure?  No    If yes to any of these questions please route to Joylene John, RN and Joella Prince, RN

## 2020-07-18 NOTE — Telephone Encounter (Signed)
No answer, left message to call back later today, B.Imaad Reuss RN. 

## 2020-07-19 ENCOUNTER — Ambulatory Visit (INDEPENDENT_AMBULATORY_CARE_PROVIDER_SITE_OTHER): Payer: Medicare Other

## 2020-07-19 VITALS — Ht 65.0 in | Wt 197.0 lb

## 2020-07-19 DIAGNOSIS — Z0000 Encounter for general adult medical examination without abnormal findings: Secondary | ICD-10-CM | POA: Diagnosis not present

## 2020-07-19 NOTE — Patient Instructions (Addendum)
You spoke to John Compton, [place] over the phone for your annual wellness visit.  We discussed goals:  Goals    . Weight (lb) < 190 lb (86.2 kg)      We also discussed recommended health maintenance. As discussed, you are up to date with everything! We can send a script to your pharmacy for a tetanus vaccine at anytime.   Health Maintenance  Topic Date Due  . TETANUS/TDAP  Never done  . COLONOSCOPY (Pts 45-76yrs Insurance coverage will need to be confirmed)  07/15/2030  . INFLUENZA VACCINE  Completed  . COVID-19 Vaccine  Completed  . Hepatitis C Screening  Completed  . PNA vac Low Risk Adult  Completed  . HPV VACCINES  Aged Out   PCP apt scheduled for 08/18/2020. Fill out an advance directive packet at PCP visit.  Wear a helmet when riding your motorcycle!  Here is an example of what a healthy plate looks like:    ? Make half your plate fruits and vegetables.     ? Focus on whole fruits.     ? Vary your veggies.  ? Make half your grains whole grains. -     ? Look for the word "whole" at the beginning of the ingredients list    ? Some whole-grain ingredients include whole oats, whole-wheat flour,        whole-grain corn, whole-grain brown rice, and whole rye.  ? Move to low-fat and fat-free milk or yogurt.  ? Vary your protein routine. - Meat, fish, poultry (chicken, [place]), eggs, beans (kidney, pinto), dairy.  ? Drink and eat less sodium, saturated fat, and added sugars.  Health Maintenance, Male Adopting a healthy lifestyle and getting preventive care are important in promoting health and wellness. Ask your health care provider about:  The right schedule for you to have regular tests and exams.  Things you can do on your own to prevent diseases and keep yourself healthy. What should I know about diet, weight, and exercise? Eat a healthy diet  Eat a diet that includes plenty of vegetables, fruits, low-fat dairy products, and lean protein.  Do not eat a lot of  foods that are high in solid fats, added sugars, or sodium.   Maintain a healthy weight Body mass index (BMI) is a measurement that can be used to identify possible weight problems. It estimates body fat based on height and weight. Your health care provider can help determine your BMI and help you achieve or maintain a healthy weight. Get regular exercise Get regular exercise. This is one of the most important things you can do for your health. Most adults should:  Exercise for at least 150 minutes each week. The exercise should increase your heart rate and make you sweat (moderate-intensity exercise).  Do strengthening exercises at least twice a week. This is in addition to the moderate-intensity exercise.  Spend less time sitting. Even light physical activity can be beneficial. Watch cholesterol and blood lipids Have your blood tested for lipids and cholesterol at 71 years of age, then have this test every 5 years. You may need to have your cholesterol levels checked more often if:  Your lipid or cholesterol levels are high.  You are older than 71 years of age.  You are at high risk for heart disease. What should I know about cancer screening? Many types of cancers can be detected early and may often be prevented. Depending on your health history and family history, you  may need to have cancer screening at various ages. This may include screening for:  Colorectal cancer.  Prostate cancer.  Skin cancer.  Lung cancer. What should I know about heart disease, diabetes, and high blood pressure? Blood pressure and heart disease  High blood pressure causes heart disease and increases the risk of stroke. This is more likely to develop in people who have high blood pressure readings, are of African descent, or are overweight.  Talk with your health care provider about your target blood pressure readings.  Have your blood pressure checked: ? Every 3-5 years if you are 18-39 years of  age. ? Every year if you are 41 years old or older.  If you are between the ages of 27 and 24 and are a current or former smoker, ask your health care provider if you should have a one-time screening for abdominal aortic aneurysm (AAA). Diabetes Have regular diabetes screenings. This checks your fasting blood sugar level. Have the screening done:  Once every three years after age 31 if you are at a normal weight and have a low risk for diabetes.  More often and at a younger age if you are overweight or have a high risk for diabetes. What should I know about preventing infection? Hepatitis B If you have a higher risk for hepatitis B, you should be screened for this virus. Talk with your health care provider to find out if you are at risk for hepatitis B infection. Hepatitis C Blood testing is recommended for:  Everyone born from [birth year] through 1966.  Anyone with known risk factors for hepatitis C. Sexually transmitted infections (STIs)  You should be screened each year for STIs, including gonorrhea and chlamydia, if: ? You are sexually active and are younger than 71 years of age. ? You are older than 71 years of age and your health care provider tells you that you are at risk for this type of infection. ? Your sexual activity has changed since you were last screened, and you are at increased risk for chlamydia or gonorrhea. Ask your health care provider if you are at risk.  Ask your health care provider about whether you are at high risk for HIV. Your health care provider may recommend a prescription medicine to help prevent HIV infection. If you choose to take medicine to prevent HIV, you should first get tested for HIV. You should then be tested every 3 months for as long as you are taking the medicine. Follow these instructions at home: Lifestyle  Do not use any products that contain nicotine or tobacco, such as cigarettes, e-cigarettes, and chewing tobacco. If you need help quitting,  ask your health care provider.  Do not use street drugs.  Do not share needles.  Ask your health care provider for help if you need support or information about quitting drugs. Alcohol use  Do not drink alcohol if your health care provider tells you not to drink.  If you drink alcohol: ? Limit how much you have to 0-2 drinks a day. ? Be aware of how much alcohol is in your drink. In the [place] one drink equals one 12 oz bottle of beer (355 mL), one 5 oz glass of wine (148 mL), or one 1 oz glass of hard liquor (44 mL). General instructions  Schedule regular health, dental, and eye exams.  Stay current with your vaccines.  Tell your health care provider if: ? You often feel depressed. ? You have ever been abused  or do not feel safe at home. Summary  Adopting a healthy lifestyle and getting preventive care are important in promoting health and wellness.  Follow your health care provider's instructions about healthy diet, exercising, and getting tested or screened for diseases.  Follow your health care provider's instructions on monitoring your cholesterol and blood pressure. This information is not intended to replace advice given to you by your health care provider. Make sure you discuss any questions you have with your health care provider. Document Revised: 04/08/2018 Document Reviewed: 04/08/2018 Elsevier Patient Education  2021 [employer].   Our clinic's number is [phone]. Please call with questions or concerns about what we discussed today.
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
## 2020-07-23 ENCOUNTER — Encounter: Payer: Self-pay | Admitting: Gastroenterology

## 2020-08-17 NOTE — Progress Notes (Signed)
    SUBJECTIVE:   CHIEF COMPLAINT / HPI:   Presents for follow up for high blood pressure. Seen in clinic on 01/24 and Amlodipine was decreased to 2.5mg  daily.  Since then patient reports BP at home 120's.  He continued to stay on the medication but has not taken it today.  He was in a rush to get to his appointment today. He exercises at the gym regularly and watches his diet. Associated symptoms include none.   PERTINENT  PMH / PSH:  HTN  OBJECTIVE:   BP 136/72   Pulse 66   Ht 5\' 5"  (1.651 m)   Wt 196 lb (88.9 kg)   SpO2 97%   BMI 32.62 kg/m    General: Alert, no acute distress Cardio: Normal S1 and S2, RRR, no r/m/g Pulm: CTAB, normal work of breathing Abdomen: Bowel sounds normal. Abdomen soft and non-tender.  Extremities: No peripheral edema.     ASSESSMENT/PLAN:   Essential hypertension, benign Initially BP 146/78.  Repeat 132/70.  Home BP 120's.  Asymptomatic.  Healthy lifestyle. -Discontinue Amlodipine -Monitor BP, if continues to stay <130 can remain off BP medication.  If elevated to 130 or greater, restart Amlodipine at current dose  -Follow up in 3/12 or sooner      Carollee Leitz, MD [place]

## 2020-08-17 NOTE — Patient Instructions (Addendum)
Thank you for coming to see me today. It was a pleasure. Today we talked about:   Stop amlodipine today.  Check your blood pressure daily, if continues to stay at 120-130/70-80 range cna stay off medication.  If elevates above this continue Amlodipine 2.5 mg daily  Cholesterol check due in July  You can schedule the shingles vaccine at your local pharmacy  Please follow-up with PCP in 2 months  If you have any questions or concerns, please do not hesitate to call the office at [phone].  Best,   Carollee Leitz, MD

## 2020-08-18 ENCOUNTER — Ambulatory Visit: Payer: Medicare Other | Admitting: Family Medicine

## 2020-08-18 ENCOUNTER — Other Ambulatory Visit: Payer: Self-pay

## 2020-08-18 ENCOUNTER — Encounter: Payer: Self-pay | Admitting: Family Medicine

## 2020-08-18 DIAGNOSIS — I10 Essential (primary) hypertension: Secondary | ICD-10-CM

## 2020-08-21 ENCOUNTER — Encounter: Payer: Self-pay | Admitting: Family Medicine

## 2020-08-21 NOTE — Assessment & Plan Note (Signed)
Initially BP 146/78.  Repeat 132/70.  Home BP 120's.  Asymptomatic.  Healthy lifestyle. -Discontinue Amlodipine -Monitor BP, if continues to stay <130 can remain off BP medication.  If elevated to 130 or greater, restart Amlodipine at current dose  -Follow up in 3/12 or sooner

## 2021-02-10 ENCOUNTER — Other Ambulatory Visit: Payer: Self-pay | Admitting: Family Medicine

## 2021-02-10 DIAGNOSIS — E785 Hyperlipidemia, unspecified: Secondary | ICD-10-CM

## 2021-04-29 ENCOUNTER — Encounter (HOSPITAL_COMMUNITY): Payer: Self-pay

## 2021-04-29 ENCOUNTER — Emergency Department (HOSPITAL_COMMUNITY)
Admission: EM | Admit: 2021-04-29 | Discharge: 2021-04-30 | Disposition: A | Discharge status: Home / Self Care | Payer: Medicare Other | Attending: Physician Assistant | Admitting: Physician Assistant

## 2021-04-29 ENCOUNTER — Other Ambulatory Visit: Payer: Self-pay

## 2021-04-29 ENCOUNTER — Emergency Department (HOSPITAL_COMMUNITY): Payer: Medicare Other

## 2021-04-29 DIAGNOSIS — R062 Wheezing: Secondary | ICD-10-CM | POA: Diagnosis not present

## 2021-04-29 DIAGNOSIS — Z20822 Contact with and (suspected) exposure to covid-19: Secondary | ICD-10-CM | POA: Insufficient documentation

## 2021-04-29 DIAGNOSIS — R059 Cough, unspecified: Secondary | ICD-10-CM | POA: Insufficient documentation

## 2021-04-29 DIAGNOSIS — R0981 Nasal congestion: Secondary | ICD-10-CM | POA: Diagnosis not present

## 2021-04-29 DIAGNOSIS — Z5321 Procedure and treatment not carried out due to patient leaving prior to being seen by health care provider: Secondary | ICD-10-CM | POA: Diagnosis not present

## 2021-04-29 MED ORDER — ALBUTEROL SULFATE HFA 108 (90 BASE) MCG/ACT IN AERS
4.0000 | INHALATION_SPRAY | Freq: Once | RESPIRATORY_TRACT | Status: AC
  Administered 2021-04-29: 4 via RESPIRATORY_TRACT
  Filled 2021-04-29: qty 6.7

## 2021-04-29 MED ORDER — PREDNISONE 20 MG PO TABS
60.0000 mg | ORAL_TABLET | Freq: Once | ORAL | Status: AC
  Administered 2021-04-29: 60 mg via ORAL
  Filled 2021-04-29: qty 3

## 2021-04-29 NOTE — ED Provider Notes (Signed)
Emergency Medicine Provider Triage Evaluation Note  Quartez Lagos , a 71 y.o. male  was evaluated in triage.  Pt complains of cough, wheezing, congestion x5 days.  Review of Systems  Positive: Cough, congestion, wheezing, fever Negative: Chest pain  Physical Exam  BP (!) 159/100    Pulse 89    Temp 98.2 F (36.8 C) (Oral)    Resp 18    SpO2 95%  Gen:   Awake, no distress   Resp:  Normal effort  MSK:   Moves extremities without difficulty  Other:  Diffuse wheezing, speaking in full sentences  Medical Decision Making  Medically screening exam initiated at 11:07 PM.  Appropriate orders placed.  Rochester Serpe was informed that the remainder of the evaluation will be completed by another provider, this initial triage assessment does not replace that evaluation, and the importance of remaining in the ED until their evaluation is complete.     Bishop Dublin 04/29/21 2309    Shanon Rosser, MD 04/30/21 [phone]

## 2021-04-29 NOTE — ED Triage Notes (Signed)
Pt c/o cough and congestion since Thursday 12/30.

## 2021-04-30 ENCOUNTER — Encounter (HOSPITAL_COMMUNITY): Payer: Self-pay | Admitting: Emergency Medicine

## 2021-04-30 ENCOUNTER — Emergency Department (HOSPITAL_COMMUNITY)
Admission: EM | Admit: 2021-04-30 | Discharge: 2021-04-30 | Disposition: A | Discharge status: Home / Self Care | Payer: Medicare Other | Source: Home / Self Care

## 2021-04-30 DIAGNOSIS — R0981 Nasal congestion: Secondary | ICD-10-CM | POA: Insufficient documentation

## 2021-04-30 DIAGNOSIS — Z5321 Procedure and treatment not carried out due to patient leaving prior to being seen by health care provider: Secondary | ICD-10-CM | POA: Insufficient documentation

## 2021-04-30 DIAGNOSIS — R059 Cough, unspecified: Secondary | ICD-10-CM | POA: Insufficient documentation

## 2021-04-30 LAB — BASIC METABOLIC PANEL
Anion gap: 6 (ref 5–15)
BUN: 13 mg/dL (ref 8–23)
CO2: 27 mmol/L (ref 22–32)
Calcium: 9.3 mg/dL (ref 8.9–10.3)
Chloride: 105 mmol/L (ref 98–111)
Creatinine, Ser: 1.08 mg/dL (ref 0.61–1.24)
GFR, Estimated: >60 mL/min (ref >60)
Glucose, Bld: 98 mg/dL (ref 70–99)
Potassium: 3.5 mmol/L (ref 3.5–5.1)
Sodium: 138 mmol/L (ref 135–145)

## 2021-04-30 LAB — CBC WITH DIFFERENTIAL/PLATELET
Abs Immature Granulocytes: 0.03 10*3/uL (ref 0.00–0.07)
Basophils Absolute: 0.1 10*3/uL (ref 0.0–0.1)
Basophils Relative: 1 %
Eosinophils Absolute: 0.6 10*3/uL — ABNORMAL HIGH (ref 0.0–0.5)
Eosinophils Relative: 7 %
HCT: 43.3 % (ref 39.0–52.0)
Hemoglobin: 13.9 g/dL (ref 13.0–17.0)
Immature Granulocytes: 0 %
Lymphocytes Relative: 34 %
Lymphs Abs: 3 10*3/uL (ref 0.7–4.0)
MCH: 26.5 pg (ref 26.0–34.0)
MCHC: 32.1 g/dL (ref 30.0–36.0)
MCV: 82.5 fL (ref 80.0–100.0)
Monocytes Absolute: 1.2 10*3/uL — ABNORMAL HIGH (ref 0.1–1.0)
Monocytes Relative: 13 %
Neutro Abs: 4 10*3/uL (ref 1.7–7.7)
Neutrophils Relative %: 45 %
Platelets: 298 10*3/uL (ref 150–400)
RBC: 5.25 MIL/uL (ref 4.22–5.81)
RDW: 15.3 % (ref 11.5–15.5)
WBC: 8.8 10*3/uL (ref 4.0–10.5)
nRBC: 0 % (ref 0.0–0.2)

## 2021-04-30 LAB — RESP PANEL BY RT-PCR (FLU A&B, COVID) ARPGX2
Influenza A by PCR: NEGATIVE
Influenza B by PCR: NEGATIVE
SARS Coronavirus 2 by RT PCR: NEGATIVE

## 2021-04-30 MED ORDER — BENZONATATE 100 MG PO CAPS: 100.0000 mg | ORAL_CAPSULE | Freq: Once | ORAL | Status: DC

## 2021-04-30 MED ORDER — ALBUTEROL SULFATE HFA 108 (90 BASE) MCG/ACT IN AERS: 4.0000 | INHALATION_SPRAY | Freq: Once | RESPIRATORY_TRACT | Status: DC

## 2021-04-30 NOTE — ED Triage Notes (Signed)
PT c/o cough and congestion x4 days.

## 2021-04-30 NOTE — ED Notes (Signed)
Patient called for triage. Family member reports patient is in the restroom.

## 2021-04-30 NOTE — ED Provider Notes (Signed)
Emergency Medicine Provider Triage Evaluation Note  John Compton , a 71 y.o. male  was evaluated in triage.  Pt complains of nasal congestion, cough, and shortness of breath x2 days.  Patient reported to the ED last night around 11 PM where he had routine labs, chest x-ray, and COVID/influenza test.  Patient left prior to being seen.  Chest x-ray negative for signs of pneumonia.  COVID/influenza negative. Routine labs reassuring. No chest pain.   Review of Systems  Positive: SOB Negative: CP  Physical Exam  BP (!) 189/100    Pulse 92    Temp 98.8 F (37.1 C)    Resp 16    Ht 5\' 5"  (1.651 m)    Wt 78 kg    SpO2 99%    BMI 28.62 kg/m  Gen:   Awake, no distress   Resp:  Normal effort  MSK:   Moves extremities without difficulty  Other:  Rhonchi and expiratory wheeze  Medical Decision Making  Medically screening exam initiated at 3:34 PM.  Appropriate orders placed.  Annette Liotta was informed that the remainder of the evaluation will be completed by another provider, this initial triage assessment does not replace that evaluation, and the importance of remaining in the ED until their evaluation is complete.     Suzy Bouchard, PA-C 04/30/21 1536    Drenda Freeze, MD 04/30/21 2154

## 2021-05-01 ENCOUNTER — Other Ambulatory Visit: Payer: Self-pay

## 2021-05-01 ENCOUNTER — Ambulatory Visit: Payer: Medicare Other | Admitting: Family Medicine

## 2021-05-01 VITALS — BP 176/94 | HR 64 | Temp 97.9°F | Ht 65.0 in | Wt 199.6 lb

## 2021-05-01 DIAGNOSIS — R0601 Orthopnea: Secondary | ICD-10-CM

## 2021-05-01 DIAGNOSIS — I10 Essential (primary) hypertension: Secondary | ICD-10-CM

## 2021-05-01 DIAGNOSIS — J111 Influenza due to unidentified influenza virus with other respiratory manifestations: Secondary | ICD-10-CM

## 2021-05-01 MED ORDER — FLUTICASONE PROPIONATE 50 MCG/ACT NA SUSP: 1.0000 | Freq: Every day | NASAL | 12 refills | Status: AC | PRN

## 2021-05-01 NOTE — Assessment & Plan Note (Signed)
Significantly elevated in the setting of illness and albuterol use, normal blood pressure prior to illness.  Asymptomatic.  Advised to monitor blood pressure and can consider adjusting medications if persistently elevated after illness has resolved.

## 2021-05-01 NOTE — Patient Instructions (Addendum)
It was nice seeing you today!  Recommend honey with or without tea for cough.  Try Flonase for nasal congestion. You can also try a neti pot.  Keep an eye on the blood pressures. If they are still elevated after recovering from this illness, let us know and we can adjust his medications.  Please return or go to the ED if you develop chest pain, severe headache, vision changes.  Please arrive at least 15 minutes prior to your scheduled appointments.  Stay well, Zola Button, MD [place] [phone]

## 2021-05-01 NOTE — Progress Notes (Signed)
° ° °  SUBJECTIVE:   CHIEF COMPLAINT / HPI:   Patient reports feeling ill starting 4 days ago with symptoms of cough, congestion, fever T-max 102.8 F with last fever 2 days ago, intermittent SOB and orthopnea.  Upon further questioning, shortness of breath appears to be mostly due to congestion.  Taking Theraflu, Mucinex.  He has gone to the ED 2 times in the past 2 days for this but never stayed for full evaluation due to long wait.  He did have test done in the ED however, notably had negative COVID, influenza; CXR obtained unremarkable, CBC and BMP overall unremarkable.  He was given 1 dose of prednisone 60 mg and an albuterol inhaler to take home.  He has been using the albuterol inhaler some but notes palpitations and believes this is causing his blood pressure to rise.  He denies headache, vision changes, chest pain.  Feels symptoms are overall improving.  Prior to this illness, wife states that his blood pressure has been normal 120s over 80s.  PERTINENT  PMH / PSH: HTN, HLD, OSA  OBJECTIVE:   BP (!) 176/94    Pulse 64    Temp 97.9 F (36.6 C)    Ht 5\' 5"  (1.651 m)    Wt 199 lb 9.6 oz (90.5 kg)    SpO2 98%    BMI 33.22 kg/m   General: Alert, NAD HEENT: MMM, posterior oropharynx clear CV: RRR, no murmurs Pulm: CTAB, no wheezes, faint diffuse rales throughout Abdomen: Soft, nontender Extremities: No edema, warm and well perfused  ASSESSMENT/PLAN:   Viral illness Symptoms are likely consistent with a viral illness.  He has had unremarkable labs, CXR, negative COVID and flu test done in the ED.  I do not think he has a bacterial pneumonia requiring antibiotics.  Symptoms are overall improving, recommended continued supportive care.  Advised honey for cough and prescribed Flonase to help with nasal congestion.  Strict return precautions given.  Given possible orthopnea (though suspect this is more likely due to congestion, CXR unremarkable without vascular congestion or pleural effusion),  will obtain BMP.  Essential hypertension, benign Significantly elevated in the setting of illness and albuterol use, normal blood pressure prior to illness.  Asymptomatic.  Advised to monitor blood pressure and can consider adjusting medications if persistently elevated after illness has resolved.     Zola Button, MD [place]

## 2021-05-02 ENCOUNTER — Encounter: Payer: Self-pay | Admitting: Family Medicine

## 2021-05-02 LAB — BRAIN NATRIURETIC PEPTIDE: BNP: 12.9 pg/mL (ref 0.0–100.0)

## 2021-05-09 ENCOUNTER — Other Ambulatory Visit: Payer: Self-pay | Admitting: Family Medicine

## 2021-08-08 ENCOUNTER — Other Ambulatory Visit: Payer: Self-pay | Admitting: Family Medicine

## 2022-03-11 ENCOUNTER — Ambulatory Visit: Payer: Medicare Other | Admitting: Family Medicine

## 2022-06-03 ENCOUNTER — Ambulatory Visit: Payer: Medicare Other | Admitting: Student

## 2022-06-03 ENCOUNTER — Encounter: Payer: Self-pay | Admitting: Student

## 2022-06-03 VITALS — BP 142/70 | HR 73 | Ht 65.0 in | Wt 194.0 lb

## 2022-06-03 DIAGNOSIS — R7303 Prediabetes: Secondary | ICD-10-CM | POA: Diagnosis not present

## 2022-06-03 DIAGNOSIS — E785 Hyperlipidemia, unspecified: Secondary | ICD-10-CM | POA: Diagnosis not present

## 2022-06-03 DIAGNOSIS — Z131 Encounter for screening for diabetes mellitus: Secondary | ICD-10-CM | POA: Diagnosis not present

## 2022-06-03 DIAGNOSIS — I10 Essential (primary) hypertension: Secondary | ICD-10-CM | POA: Diagnosis not present

## 2022-06-03 LAB — POCT GLYCOSYLATED HEMOGLOBIN (HGB A1C): HbA1c, POC (controlled diabetic range): 6 % (ref 0.0–7.0)

## 2022-06-03 NOTE — Patient Instructions (Signed)
It was great to see you! Thank you for allowing me to participate in your care!  We saw you for a regular check up!   Our plans for today:  - Watching Blood Pressure - Checking cholesterol today - Checking for diabetes today - Colonoscopy due next year-  We are checking some labs today, I will call you if they are abnormal will send you a MyChart message or a letter if they are normal.  If you do not hear about your labs in the next 2 weeks please let us know.  Take care and seek immediate care sooner if you develop any concerns.   Dr. Holley Bouche, MD [place]

## 2022-06-03 NOTE — Progress Notes (Signed)
  SUBJECTIVE:   CHIEF COMPLAINT / HPI:    HTN BP Readings from Last 3 Encounters:  06/03/22 (!) 142/70  05/01/21 (!) 176/94  04/30/21 (!) 167/79  Meds: Previously on amlodipine 2.5, d/c for good BP control and healthy routine exercise, but restarted at later date. BP noted to be severely elevated in January 23, during viral illness. Reports: taking BP med regularly amlodipine 2.5.    HLD Lab Results  Component Value Date   CHOL 139 06/03/2022   HDL 57 06/03/2022   LDLCALC 67 06/03/2022   LDLDIRECT 60 03/15/2019   TRIG 73 06/03/2022   CHOLHDL 2.4 06/03/2022  Meds: Atorvostatin 20 mg   [facility] Colonoscopy: Next due 07/15/23   DM screening Last A1c Lab Results  Component Value Date   HGBA1C 6.0 06/03/2022  Low concern for DM Diet: 2 meals a day, not a lot of carbs, fruits and nuts.    PERTINENT  PMH / PSH: HTN  Knee pain: Happens infrequently, usually associated w/ shoes he wears, takes tylenol for it. Pain located in lateral joint line.  OBJECTIVE:  BP (!) 142/70   Pulse 73   Ht 5' 5"$  (1.651 m)   Wt 194 lb (88 kg)   SpO2 96%   BMI 32.28 kg/m  Physical Exam Constitutional:      General: He is not in acute distress.    Appearance: Normal appearance. He is obese. He is not ill-appearing.  Cardiovascular:     Rate and Rhythm: Normal rate and regular rhythm.     Pulses: Normal pulses.     Heart sounds: Normal heart sounds. No murmur heard.    No friction rub. No gallop.  Pulmonary:     Effort: Pulmonary effort is normal. No respiratory distress.     Breath sounds: Normal breath sounds. No stridor. No wheezing, rhonchi or rales.  Abdominal:     General: There is no distension.     Palpations: Abdomen is soft. There is no mass.     Tenderness: There is no abdominal tenderness. There is no guarding.  Neurological:     Mental Status: He is alert.      ASSESSMENT/PLAN:  Screening for diabetes mellitus -     POCT glycosylated hemoglobin (Hb  A1C)  Hyperlipidemia, unspecified hyperlipidemia type Assessment & Plan: Will check lipid panel today. Patient report's good compliance with statin. -lipid panel  Orders: -     Lipid panel  Essential hypertension, benign Assessment & Plan: Patient BP elevated during this visit and last 2 checks. Will increase patient's antihypertensive dose of amlodipine.  -Amlodipine 5 mg daily -BMP -BP check in 1 month  Orders: -     Basic metabolic panel  Prediabetes Assessment & Plan: Patient A1c today 6.0, making him prediabetic. Will elect to manage with eating healthier, and exercise. Will continue to follow and re-evaluate in 6 months. -Diet and exercise -F/u 6 months   Other orders -     amLODIPine Besylate; Take 1 tablet (5 mg total) by mouth daily.  Dispense: 30 tablet; Refill: 2   No follow-ups on file. Holley Bouche, MD 06/14/2022, 9:35 AM PGY-2, [place]

## 2022-06-04 LAB — LIPID PANEL
Chol/HDL Ratio: 2.4 ratio (ref 0.0–5.0)
Cholesterol, Total: 139 mg/dL (ref 100–199)
HDL: 57 mg/dL (ref >39)
LDL Chol Calc (NIH): 67 mg/dL (ref 0–99)
Triglycerides: 73 mg/dL (ref 0–149)
VLDL Cholesterol Cal: 15 mg/dL (ref 5–40)

## 2022-06-04 LAB — BASIC METABOLIC PANEL
BUN/Creatinine Ratio: 14 (ref 10–24)
BUN: 14 mg/dL (ref 8–27)
CO2: 23 mmol/L (ref 20–29)
Calcium: 10 mg/dL (ref 8.6–10.2)
Chloride: 101 mmol/L (ref 96–106)
Creatinine, Ser: 1 mg/dL (ref 0.76–1.27)
Glucose: 83 mg/dL (ref 70–99)
Potassium: 3.8 mmol/L (ref 3.5–5.2)
Sodium: 141 mmol/L (ref 134–144)
eGFR: 79 mL/min/{1.73_m2} (ref >59)

## 2022-06-14 DIAGNOSIS — R7303 Prediabetes: Secondary | ICD-10-CM | POA: Insufficient documentation

## 2022-06-14 MED ORDER — AMLODIPINE BESYLATE 5 MG PO TABS: 5.0000 mg | ORAL_TABLET | Freq: Every day | ORAL | 2 refills | Status: DC

## 2022-06-14 NOTE — Assessment & Plan Note (Addendum)
Patient BP elevated during this visit and last 2 checks. Will increase patient's antihypertensive dose of amlodipine.  -Amlodipine 5 mg daily -BMP -BP check in 1 month

## 2022-06-14 NOTE — Assessment & Plan Note (Signed)
Patient A1c today 6.0, making him prediabetic. Will elect to manage with eating healthier, and exercise. Will continue to follow and re-evaluate in 6 months. -Diet and exercise -F/u 6 months

## 2022-06-14 NOTE — Assessment & Plan Note (Signed)
Will check lipid panel today. Patient report's good compliance with statin. -lipid panel

## 2022-06-27 ENCOUNTER — Telehealth: Payer: Self-pay | Admitting: Student

## 2022-06-27 NOTE — Telephone Encounter (Signed)
Patient called stating doctor had called him, he was returning the call. Stated best call back number is [phone].  Please Advise.   Thanks!

## 2022-07-04 NOTE — Telephone Encounter (Signed)
Spoke with patient over the phone.

## 2022-07-24 ENCOUNTER — Ambulatory Visit: Payer: Medicare Other | Admitting: Student

## 2022-07-24 VITALS — BP 130/80 | HR 78 | Ht 65.0 in | Wt 190.0 lb

## 2022-07-24 DIAGNOSIS — H6090 Unspecified otitis externa, unspecified ear: Secondary | ICD-10-CM | POA: Insufficient documentation

## 2022-07-24 DIAGNOSIS — H60391 Other infective otitis externa, right ear: Secondary | ICD-10-CM

## 2022-07-24 MED ORDER — NEOMYCIN-POLYMYXIN-HC 3.5-10000-1 OT SUSP: 4.0000 [drp] | Freq: Four times a day (QID) | OTIC | 0 refills | Status: AC

## 2022-07-24 NOTE — Assessment & Plan Note (Addendum)
Cortisporin 4 times a day x 10 days, no red flag symptoms at present. Patient needs to return in 7-10 days to reassess the ear for visualization of the TM. Verbalized understanding.

## 2022-07-24 NOTE — Progress Notes (Signed)
  SUBJECTIVE:   CHIEF COMPLAINT / HPI:   Right Ear Pain: Patient reports right ear fullness for the past couple of weeks He has intermittent right ear pain that really began last night. He notes he has muffled hearing since the onset He has tried to use Q-tips with no benefit No related headaches, fever, chills, systemic symptoms He did try Debrox over-the-counter that was also not beneficial No ear infections in the recent past No ear surgeries  No swimming or trauma recently  Non smoker   PERTINENT  PMH / PSH:   HLD HTN Cataracts    Patient Care Team: Holley Bouche, MD as PCP - General (Family Medicine) OBJECTIVE:  BP 130/80   Pulse 78   Ht 5\' 5"  (1.651 m)   Wt 190 lb (86.2 kg)   SpO2 98%   BMI 31.62 kg/m  Physical Exam  General: NAD, pleasant, able to participate in exam HEENT:     Head: Normocephalic, atraumatic    Neck: No masses    Ears: External ears normal, no drainage. Left ear: Tympanic membranes intact, normal light reflex bilaterally, no erythema or bulging Right ear: Unable to visualize the tympanic membrane due to swelling in the external canal, presence of debris with no evidence of drainage, significant erythema in the external ear.    Eyes: PERRLA, EOMI    Nose: nasal turbinates moist, no nasal discharge Respiratory: No respiratory distress Skin: warm and dry, no rashes noted Psych: Normal affect and mood   ASSESSMENT/PLAN:  Other infective acute otitis externa of right ear Assessment & Plan: Cortisporin 4 times a day x 10 days, no red flag symptoms at present. Patient needs to return in 7-10 days to reassess the ear for visualization of the TM. Verbalized understanding.    Other orders -     Neomycin-Polymyxin-HC; Place 4 drops into both ears 4 (four) times daily for 10 days.  Dispense: 10 mL; Refill: 0   Return in about 1 week (around 07/31/2022) for ear recheck . Erskine Emery, MD 07/24/2022, 11:54 AM PGY-2, [place]

## 2022-07-24 NOTE — Patient Instructions (Signed)
It was great to see you today! Thank you for choosing [facility] for your primary care. Jackie Khawaja was seen for ear fullness.  Today we addressed: Use the antibiotic drop 4 times a day for a total of ten days  Return in 7 days for [place] to look at the ear again  Avoid Qtips   If you haven't already, sign up for My Chart to have easy access to your labs results, and communication with your primary care physician.  I recommend that you always bring your medications to each appointment as this makes it easy to ensure you are on the correct medications and helps [place] not miss refills when you need them. Call the clinic at [phone] if your symptoms worsen or you have any concerns.  You should return to our clinic Return in about 1 week (around 07/31/2022) for ear recheck . Please arrive 15 minutes before your appointment to ensure smooth check in process.  We appreciate your efforts in making this happen.  Thank you for allowing me to participate in your care, Erskine Emery, MD 07/24/2022, 11:22 AM PGY-2, [place]

## 2022-07-30 ENCOUNTER — Telehealth: Payer: Self-pay | Admitting: Student

## 2022-07-30 NOTE — Telephone Encounter (Signed)
Contacted John Compton to schedule their annual wellness visit. Appointment made for 08/02/2022.  Thank you,  [place] Direct dial  [phone]

## 2022-08-01 NOTE — Progress Notes (Signed)
  I attempted to contact the patient for her scheduled Virtual Telephone Annual Wellness Visit. No answer. I left a detailed message on the patient's voicemail.   

## 2022-08-01 NOTE — Patient Instructions (Signed)
Health Maintenance, Male Adopting a healthy lifestyle and getting preventive care are important in promoting health and wellness. Ask your health care provider about: The right schedule for you to have regular tests and exams. Things you can do on your own to prevent diseases and keep yourself healthy. What should I know about diet, weight, and exercise? Eat a healthy diet  Eat a diet that includes plenty of vegetables, fruits, low-fat dairy products, and lean protein. Do not eat a lot of foods that are high in solid fats, added sugars, or sodium. Maintain a healthy weight Body mass index (BMI) is a measurement that can be used to identify possible weight problems. It estimates body fat based on height and weight. Your health care provider can help determine your BMI and help you achieve or maintain a healthy weight. Get regular exercise Get regular exercise. This is one of the most important things you can do for your health. Most adults should: Exercise for at least 150 minutes each week. The exercise should increase your heart rate and make you sweat (moderate-intensity exercise). Do strengthening exercises at least twice a week. This is in addition to the moderate-intensity exercise. Spend less time sitting. Even light physical activity can be beneficial. Watch cholesterol and blood lipids Have your blood tested for lipids and cholesterol at 73 years of age, then have this test every 5 years. You may need to have your cholesterol levels checked more often if: Your lipid or cholesterol levels are high. You are older than 73 years of age. You are at high risk for heart disease. What should I know about cancer screening? Many types of cancers can be detected early and may often be prevented. Depending on your health history and family history, you may need to have cancer screening at various ages. This may include screening for: Colorectal cancer. Prostate cancer. Skin cancer. Lung  cancer. What should I know about heart disease, diabetes, and high blood pressure? Blood pressure and heart disease High blood pressure causes heart disease and increases the risk of stroke. This is more likely to develop in people who have high blood pressure readings or are overweight. Talk with your health care provider about your target blood pressure readings. Have your blood pressure checked: Every 3-5 years if you are 18-39 years of age. Every year if you are 40 years old or older. If you are between the ages of 65 and 75 and are a current or former smoker, ask your health care provider if you should have a one-time screening for abdominal aortic aneurysm (AAA). Diabetes Have regular diabetes screenings. This checks your fasting blood sugar level. Have the screening done: Once every three years after age 45 if you are at a normal weight and have a low risk for diabetes. More often and at a younger age if you are overweight or have a high risk for diabetes. What should I know about preventing infection? Hepatitis B If you have a higher risk for hepatitis B, you should be screened for this virus. Talk with your health care provider to find out if you are at risk for hepatitis B infection. Hepatitis C Blood testing is recommended for: Everyone born from 1945 through 1965. Anyone with known risk factors for hepatitis C. Sexually transmitted infections (STIs) You should be screened each year for STIs, including gonorrhea and chlamydia, if: You are sexually active and are younger than 73 years of age. You are older than 73 years of age and your   health care provider tells you that you are at risk for this type of infection. Your sexual activity has changed since you were last screened, and you are at increased risk for chlamydia or gonorrhea. Ask your health care provider if you are at risk. Ask your health care provider about whether you are at high risk for HIV. Your health care provider  may recommend a prescription medicine to help prevent HIV infection. If you choose to take medicine to prevent HIV, you should first get tested for HIV. You should then be tested every 3 months for as long as you are taking the medicine. Follow these instructions at home: Alcohol use Do not drink alcohol if your health care provider tells you not to drink. If you drink alcohol: Limit how much you have to 0-2 drinks a day. Know how much alcohol is in your drink. In the [place] one drink equals one 12 oz bottle of beer (355 mL), one 5 oz glass of wine (148 mL), or one 1 oz glass of hard liquor (44 mL). Lifestyle Do not use any products that contain nicotine or tobacco. These products include cigarettes, chewing tobacco, and vaping devices, such as e-cigarettes. If you need help quitting, ask your health care provider. Do not use street drugs. Do not share needles. Ask your health care provider for help if you need support or information about quitting drugs. General instructions Schedule regular health, dental, and eye exams. Stay current with your vaccines. Tell your health care provider if: You often feel depressed. You have ever been abused or do not feel safe at home. Summary Adopting a healthy lifestyle and getting preventive care are important in promoting health and wellness. Follow your health care provider's instructions about healthy diet, exercising, and getting tested or screened for diseases. Follow your health care provider's instructions on monitoring your cholesterol and blood pressure. This information is not intended to replace advice given to you by your health care provider. Make sure you discuss any questions you have with your health care provider. Document Revised: 09/04/2020 Document Reviewed: 09/04/2020 Elsevier Patient Education  2023 Elsevier Inc.  

## 2022-08-02 ENCOUNTER — Ambulatory Visit: Payer: Medicare Other | Admitting: Student

## 2022-08-02 ENCOUNTER — Encounter: Payer: Self-pay | Admitting: Student

## 2022-08-02 ENCOUNTER — Ambulatory Visit (INDEPENDENT_AMBULATORY_CARE_PROVIDER_SITE_OTHER): Payer: Medicare Other

## 2022-08-02 VITALS — BP 140/70 | HR 70 | Ht 65.0 in | Wt 191.0 lb

## 2022-08-02 DIAGNOSIS — H93231 Hyperacusis, right ear: Secondary | ICD-10-CM | POA: Diagnosis not present

## 2022-08-02 DIAGNOSIS — H60501 Unspecified acute noninfective otitis externa, right ear: Secondary | ICD-10-CM | POA: Diagnosis not present

## 2022-08-02 DIAGNOSIS — Z0000 Encounter for general adult medical examination without abnormal findings: Secondary | ICD-10-CM

## 2022-08-02 NOTE — Patient Instructions (Addendum)
It was great to see you! Thank you for allowing me to participate in your care!  I recommend that you always bring your medications to each appointment as this makes it easy to ensure we are on the correct medications and helps [place] not miss when refills are needed.  Our plans for today:  - Ear infection  Your ear doesn't look infected, but your hearing may be different after the infection. We are going to send you to audiology.    Continue antibiotics until completion of 10 days  IF ear pain with  headache, or fever develop, go to emergency room to be seen right away.   - High Blood Pressure Your blood pressure was a little elevated. We will keep an eye on this. Make follow up appointment to be seen in 1-2 months, will also check A1c again.   Take care and seek immediate care sooner if you develop any concerns.   Dr. Bess Kinds, MD [facility] Medicine
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
## 2022-08-06 DIAGNOSIS — H93231 Hyperacusis, right ear: Secondary | ICD-10-CM | POA: Insufficient documentation

## 2022-08-06 NOTE — Assessment & Plan Note (Signed)
Appears to have resolved. Patient to finish last dose of abx tomorrow. -Complete abx

## 2022-08-06 NOTE — Assessment & Plan Note (Signed)
Patient reports muffled hearing since he had Otitis Externa. He completed a cull course of abx, and ear does not appear to be infected, TM does however look scared. Suspect patient had a little hearing deficit previously that was acutely made worse with the ear infection. Will refer to audiology -Amb Ref Audiology

## 2022-08-08 ENCOUNTER — Telehealth: Payer: Self-pay

## 2022-08-08 NOTE — Telephone Encounter (Signed)
Patients wife leaves VM on nurse line requesting an up date on Audiology referral.   Advised of her of the time frame for all referrals.   Will contact her once he has been placed.

## 2022-08-09 NOTE — Telephone Encounter (Signed)
Attempted to contact wife.   No answer or option for VM.

## 2022-08-12 ENCOUNTER — Ambulatory Visit: Payer: Medicare Other | Attending: Audiologist | Admitting: Audiologist

## 2022-08-12 DIAGNOSIS — H9193 Unspecified hearing loss, bilateral: Secondary | ICD-10-CM | POA: Insufficient documentation

## 2022-08-12 DIAGNOSIS — H60393 Other infective otitis externa, bilateral: Secondary | ICD-10-CM | POA: Diagnosis present

## 2022-08-12 NOTE — Procedures (Signed)
  Outpatient Audiology and [facility] [address] [place], [place]  [address] [phone]  AUDIOLOGICAL  EVALUATION  NAME: John Compton     DOB:   November 01, 1950      MRN: [id number]                                                                                     DATE: 08/12/2022     REFERENT: Bess Kinds, MD STATUS: Outpatient DIAGNOSIS: Otitis Externa   History: John Compton was seen for an audiological evaluation. John Compton recently had bilateral otitis externa causing muffled hearing. He completed a full course of antibiotics. He feels he can hear much better now. He has no concerns for his hearing now that the infection has cleared up. No history of noise exposure. No other history reported.   Evaluation:  Otoscopy showed a clear view of the tympanic membranes showing red canals and some white debris, bilaterally Tympanometry results were consistent with flat responses, bilaterally  consistent with a middle ear infection Audiometric testing was completed using Conventional Audiometry techniques with insert earphones and TDH headphones. Test results are consistent with normal hearing 250-8kHz bilaterally. Speech Recognition Thresholds were obtained at 15dB HL in the right ear and at 10dB HL in the left ear. Word Recognition Testing was completed at 40dB SL and John Compton scored 100% in each ear.   Results:  The test results were reviewed with John Compton and his wife. John Compton has normal hearing. Middle ear still not functioning normally and canals look irritated. If symptoms of infections reappear then return to Bess Kinds, MD.   Recommendations: 1.   No further testing is recommended at this time. If speech/language delays or hearing difficulties are observed further audiological testing is recommended.          23 minutes spent testing and counseling on results.   If you have any questions please feel free to contact me at [phone].  Ammie Ferrier Audiologist, Au.D.,  CCC-A 08/12/2022  4:15 PM  Cc: Bess Kinds, MD

## 2022-09-06 ENCOUNTER — Telehealth: Payer: Self-pay

## 2022-09-06 NOTE — Telephone Encounter (Signed)
Patient attempted to be outreached by Jeniffer Culliver, PharmD Candidate on 09/06/2022 to discuss hypertension. Left voicemail for patient to return our call at their convenience at [phone]  Ruthy Forry, PharmD Candidate 

## 2022-09-09 ENCOUNTER — Ambulatory Visit: Payer: Medicare Other | Admitting: Student
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
## 2022-10-04 ENCOUNTER — Other Ambulatory Visit: Payer: Self-pay | Admitting: Student

## 2023-01-16 ENCOUNTER — Other Ambulatory Visit: Payer: Self-pay

## 2023-01-16 DIAGNOSIS — E785 Hyperlipidemia, unspecified: Secondary | ICD-10-CM

## 2023-01-16 MED ORDER — ATORVASTATIN CALCIUM 20 MG PO TABS: 20.0000 mg | ORAL_TABLET | Freq: Every day | ORAL | 3 refills | Status: DC

## 2023-12-05 IMAGING — DX DG CHEST 1V PORT
1 series · 1 of 1 positions shown · non-contrast
Comparison: 01/21/2011

CLINICAL DATA: Cough, congestion

EXAM:
PORTABLE CHEST 1 VIEW

[chest ap]
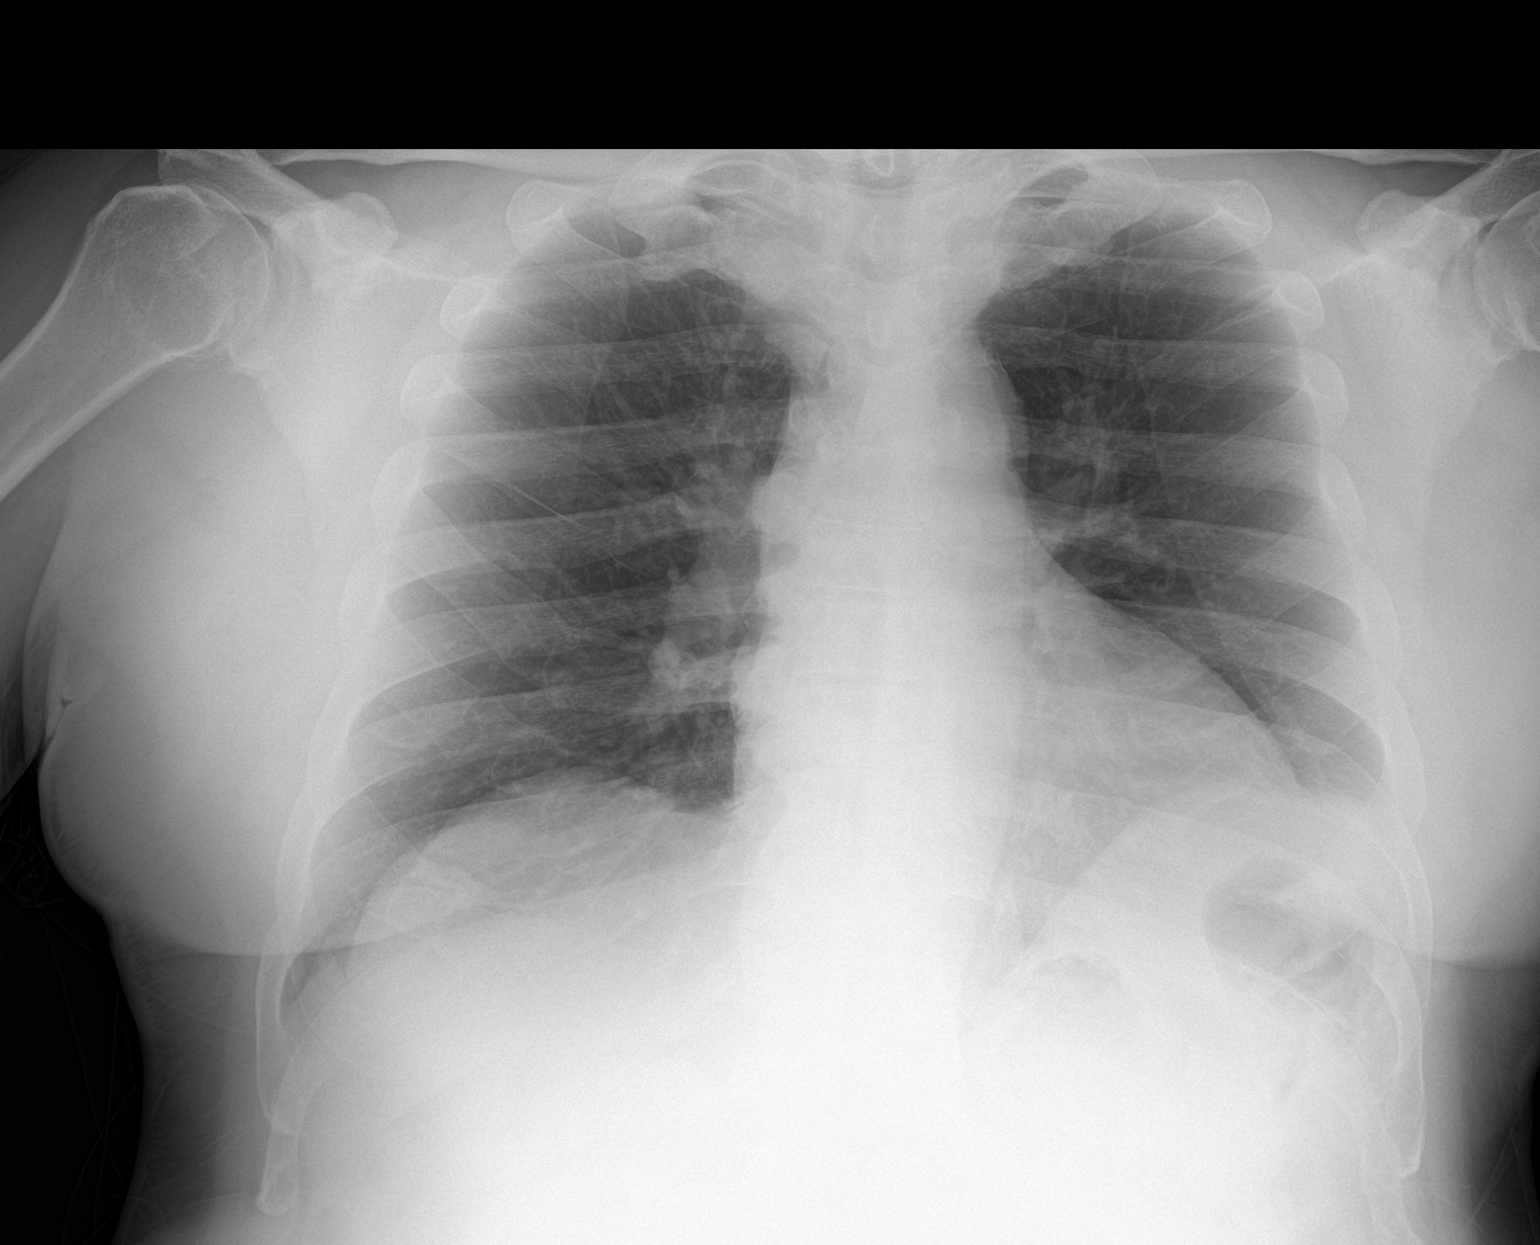

[1 of 1 positions shown; findings below may reference images not displayed]

FINDINGS: The heart size and mediastinal contours are within normal limits.
Both lungs are clear. The visualized skeletal structures are
unremarkable.
IMPRESSION: No active disease.

## 2024-01-23 ENCOUNTER — Encounter: Payer: Self-pay | Admitting: Gastroenterology

## 2024-02-09 ENCOUNTER — Encounter: Payer: Self-pay | Admitting: Gastroenterology

## 2024-02-26 ENCOUNTER — Other Ambulatory Visit: Payer: Self-pay

## 2024-02-26 DIAGNOSIS — E785 Hyperlipidemia, unspecified: Secondary | ICD-10-CM

## 2024-02-26 MED ORDER — ATORVASTATIN CALCIUM 20 MG PO TABS: 20.0000 mg | ORAL_TABLET | Freq: Every day | ORAL | 3 refills | Status: AC

## 2024-02-26 NOTE — Telephone Encounter (Signed)
 Chart reviewed. Rx refilled.

## 2024-02-27 ENCOUNTER — Ambulatory Visit (AMBULATORY_SURGERY_CENTER)

## 2024-02-27 VITALS — Ht 65.0 in | Wt 194.5 lb

## 2024-02-27 DIAGNOSIS — Z860100 Personal history of colon polyps, unspecified: Secondary | ICD-10-CM

## 2024-02-27 MED ORDER — SUTAB 1479-225-188 MG PO TABS: 12.0000 | ORAL_TABLET | ORAL | 0 refills | Status: DC

## 2024-02-27 NOTE — Progress Notes (Signed)
 No egg or soy allergy known to patient  No issues known to pt with past sedation with any surgeries or procedures Patient denies ever being told they had issues or difficulty with intubation  No FH of Malignant Hyperthermia Pt is not on diet pills Pt is not on  home 02  Pt is not on blood thinners  Pt denies issues with constipation  No A fib or A flutter Have any cardiac testing pending--no  LOA: independent  Prep: sutab    PV completed with patient. Prep instructions sent via mychart and hard copy provided at [facility] apt

## 2024-03-16 ENCOUNTER — Encounter: Payer: Self-pay | Admitting: Gastroenterology

## 2024-03-19 ENCOUNTER — Ambulatory Visit (AMBULATORY_SURGERY_CENTER): Admitting: Gastroenterology

## 2024-03-19 ENCOUNTER — Encounter: Payer: Self-pay | Admitting: Gastroenterology

## 2024-03-19 VITALS — BP 138/95 | HR 85 | Temp 98.2°F | Resp 17 | Ht 65.0 in | Wt 187.0 lb

## 2024-03-19 DIAGNOSIS — K648 Other hemorrhoids: Secondary | ICD-10-CM

## 2024-03-19 DIAGNOSIS — Z1211 Encounter for screening for malignant neoplasm of colon: Secondary | ICD-10-CM

## 2024-03-19 DIAGNOSIS — Q439 Congenital malformation of intestine, unspecified: Secondary | ICD-10-CM | POA: Diagnosis not present

## 2024-03-19 DIAGNOSIS — K562 Volvulus: Secondary | ICD-10-CM | POA: Diagnosis not present

## 2024-03-19 DIAGNOSIS — Z860100 Personal history of colon polyps, unspecified: Secondary | ICD-10-CM

## 2024-03-19 MED ORDER — SODIUM CHLORIDE 0.9 % IV SOLN: 500.0000 mL | Freq: Once | INTRAVENOUS | Status: AC

## 2024-03-19 NOTE — Progress Notes (Signed)
 1215 Report given to PACU.  Lopressor  1 mg given and albuterol  neb performed.

## 2024-03-19 NOTE — Patient Instructions (Signed)
 YOU HAD AN ENDOSCOPIC PROCEDURE TODAY AT THE [facility]:   Refer to the procedure report that was given to you for any specific questions about what was found during the examination.  If the procedure report does not answer your questions, please call your gastroenterologist to clarify.  If you requested that your care partner not be given the details of your procedure findings, then the procedure report has been included in a sealed envelope for you to review at your convenience later.  YOU SHOULD EXPECT: Some feelings of bloating in the abdomen. Passage of more gas than usual.  Walking can help get rid of the air that was put into your GI tract during the procedure and reduce the bloating. If you had a lower endoscopy (such as a colonoscopy or flexible sigmoidoscopy) you may notice spotting of blood in your stool or on the toilet paper. If you underwent a bowel prep for your procedure, you may not have a normal bowel movement for a few days.  Please Note:  You might notice some irritation and congestion in your nose or some drainage.  This is from the oxygen used during your procedure.  There is no need for concern and it should clear up in a day or so.  SYMPTOMS TO REPORT IMMEDIATELY:  Following lower endoscopy (colonoscopy or flexible sigmoidoscopy):  Excessive amounts of blood in the stool  Significant tenderness or worsening of abdominal pains  Swelling of the abdomen that is new, acute  Fever of 100F or higher  Resume previous diet Continue present medications Repeat colonoscopy in 5 years for surveillance   For urgent or emergent issues, a gastroenterologist can be reached at any hour by calling [phone]. Do not use MyChart messaging for urgent concerns.    DIET:  We do recommend a small meal at first, but then you may proceed to your regular diet.  Drink plenty of fluids but you should avoid alcoholic beverages for 24 hours.  ACTIVITY:  You should plan to take  it easy for the rest of today and you should NOT DRIVE or use heavy machinery until tomorrow (because of the sedation medicines used during the test).    FOLLOW UP: Our staff will call the number listed on your records the next business day following your procedure.  We will call around 7:15- 8:00 am to check on you and address any questions or concerns that you may have regarding the information given to you following your procedure. If we do not reach you, we will leave a message.     If any biopsies were taken you will be contacted by phone or by letter within the next 1-3 weeks.  Please call us  at [phone] if you have not heard about the biopsies in 3 weeks.    SIGNATURES/CONFIDENTIALITY: You and/or your care partner have signed paperwork which will be entered into your electronic medical record.  These signatures attest to the fact that that the information above on your After Visit Summary has been reviewed and is understood.  Full responsibility of the confidentiality of this discharge information lies with you and/or your care-partner.

## 2024-03-19 NOTE — Progress Notes (Signed)
 1151 HR > 100 with esmolol 25 mg given IV, MD updated, vss

## 2024-03-19 NOTE — Progress Notes (Signed)
 1150 Robinul 0.2 mg IV given due large amount of secretions upon assessment.  Patient experiencing nausea and vomiting.  MD updated and Zofran 4 mg IV given.
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
## 2024-03-19 NOTE — Op Note (Signed)
 [facility] Patient Name: John Compton Procedure Date: 03/19/2024 11:33 AM MRN: [id number] Endoscopist: Elspeth P. Leigh , MD, [id number] Age: 73 Referring MD:  Date of Birth: 11-23-1950 Gender: Male Account #: [id number] Procedure:                Colonoscopy Indications:              High risk colon cancer surveillance: Personal                            history of colonic polyps - 10 polyps removed                            06/2020 - technically challenging cecal intubation                            on the last exam Medicines:                Monitored Anesthesia Care Procedure:                Pre-Anesthesia Assessment:                           - Prior to the procedure, a History and Physical                            was performed, and patient medications and                            allergies were reviewed. The patient's tolerance of                            previous anesthesia was also reviewed. The risks                            and benefits of the procedure and the sedation                            options and risks were discussed with the patient.                            All questions were answered, and informed consent                            was obtained. Prior Anticoagulants: The patient has                            taken no anticoagulant or antiplatelet agents. ASA                            Grade Assessment: II - A patient with mild systemic                            disease. After reviewing the risks and benefits,  the patient was deemed in satisfactory condition to                            undergo the procedure.                           After obtaining informed consent, the colonoscope                            was passed under direct vision. Throughout the                            procedure, the patient's blood pressure, pulse, and                            oxygen saturations were monitored continuously.  The                            Olympus Scope SN [id number] was introduced through the                            anus and advanced to the the cecum, identified by                            appendiceal orifice and ileocecal valve. The                            colonoscopy was technically difficult and complex                            due to a redundant colon. The patient tolerated the                            procedure well. The quality of the bowel                            preparation was adequate. The ileocecal valve,                            appendiceal orifice, and rectum were photographed. Scope In: 11:37:02 AM Scope Out: 12:07:28 PM Scope Withdrawal Time: 0 hours 7 minutes 39 seconds  Total Procedure Duration: 0 hours 30 minutes 26 seconds  Findings:                 The perianal and digital rectal examinations were                            normal.                           The colon was long / redundant with significant                            looping. This made for a technically challenging  cecal intubation. Positional changes and abdominal                            pressure were used to achieve cecal intubation                            (pressure in the very left lower quadrant worked                            best - looping occurred in the sigmoid colon.                           Internal hemorrhoids were found during retroflexion.                           The exam was otherwise without abnormality. Complications:            No immediate complications. Estimated blood loss:                            None. Estimated Blood Loss:     Estimated blood loss: none. Impression:               - Long / redundant colon with looping - difficult                            cecal intubation as outlined.                           - Internal hemorrhoids.                           - The examination was otherwise normal.                           - No  polyps. Recommendation:           - Patient has a contact number available for                            emergencies. The signs and symptoms of potential                            delayed complications were discussed with the                            patient. Return to normal activities tomorrow.                            Written discharge instructions were provided to the                            patient.                           - Resume previous diet.                           -  Continue present medications.                           - Repeat colonoscopy in 5 years for surveillance                            given burden of polyps on the last exam. Elspeth P. Wilmetta Speiser, MD 03/19/2024 12:12:36 PM This report has been signed electronically.

## 2024-03-19 NOTE — Progress Notes (Signed)
 Pt's states no medical or surgical changes since previsit or office visit.

## 2024-03-22 ENCOUNTER — Telehealth: Payer: Self-pay

## 2024-03-22 NOTE — Telephone Encounter (Signed)
  Follow up Call-     03/19/2024   10:51 AM  Call back number  Post procedure Call Back phone  # [phone]  Permission to leave phone message Yes     Patient questions:  Do you have a fever, pain , or abdominal swelling? No. Pain Score  0 *  Have you tolerated food without any problems? Yes.    Have you been able to return to your normal activities? Yes.    Do you have any questions about your discharge instructions: Diet   No. Medications  No. Follow up visit  No.  Do you have questions or concerns about your Care? No.  Actions: * If pain score is 4 or above: No action needed, pain <4.

## 2024-03-24 ENCOUNTER — Other Ambulatory Visit: Payer: Self-pay

## 2024-03-27 MED ORDER — AMLODIPINE BESYLATE 5 MG PO TABS: 5.0000 mg | ORAL_TABLET | Freq: Every day | ORAL | 1 refills | Status: DC

## 2024-03-27 NOTE — Telephone Encounter (Signed)
 Chart reviewed. Rx refilled. Requesting patient follow up.

## 2024-05-04 ENCOUNTER — Ambulatory Visit: Payer: Self-pay | Admitting: Family Medicine

## 2024-05-04 ENCOUNTER — Telehealth: Payer: Self-pay

## 2024-05-04 ENCOUNTER — Encounter: Payer: Self-pay | Admitting: Family Medicine

## 2024-05-04 VITALS — BP 160/82 | HR 64 | Ht 65.0 in | Wt 194.0 lb

## 2024-05-04 DIAGNOSIS — I10 Essential (primary) hypertension: Secondary | ICD-10-CM | POA: Diagnosis present

## 2024-05-04 DIAGNOSIS — R7303 Prediabetes: Secondary | ICD-10-CM | POA: Diagnosis not present

## 2024-05-04 DIAGNOSIS — G4733 Obstructive sleep apnea (adult) (pediatric): Secondary | ICD-10-CM | POA: Diagnosis not present

## 2024-05-04 DIAGNOSIS — E785 Hyperlipidemia, unspecified: Secondary | ICD-10-CM | POA: Diagnosis not present

## 2024-05-04 LAB — POCT GLYCOSYLATED HEMOGLOBIN (HGB A1C): HbA1c, POC (prediabetic range): 5.8 % (ref 5.7–6.4)

## 2024-05-04 MED ORDER — AMLODIPINE-OLMESARTAN 5-20 MG PO TABS: 1.0000 | ORAL_TABLET | Freq: Every day | ORAL | 3 refills | Status: AC

## 2024-05-04 NOTE — Progress Notes (Signed)
" ° ° °  SUBJECTIVE:   CHIEF COMPLAINT / HPI:   HTN Last took amlodipine  5 yesterday morning  No CP, SOB, HA, VC, swelling   OSA  Completed sleep study years ago Unfortunately cpap was too expensive prior and never started  Still snoring loudly with witnessed pauses in breathing per wife Interested in cpap   PERTINENT  PMH / PSH: HTN, OSA  OBJECTIVE:   BP (!) 160/82   Pulse 64   Ht 5' 5 (1.651 m)   Wt 194 lb (88 kg)   SpO2 97%   BMI 32.28 kg/m   General: Well-appearing. Resting comfortably in room. CV: Normal S1/S2. No extra heart sounds. Warm and well-perfused. Pulm: Breathing comfortably on room air. CTAB. No increased WOB. Abd: Soft, non-tender, non-distended. Skin:  Warm, dry. Psych: Pleasant and appropriate.    ASSESSMENT/PLAN:   Assessment & Plan Essential hypertension, benign BP remains elevated on repeat today. Patient asymptomatic. Discussed updating BP regimen to Azor  5-20. Plan for patient to return to clinic next week for BP fu and kidney function check. Urgent precautions discussed.  OSA (obstructive sleep apnea) Per chart review, patient completed sleep study with [facility] in 2018. CPAP DME ordered per settings previously noted by Pulmonology: Trial of CPAP therapy on 11 cm H2O with a Medium size Fisher&Paykel Nasal Mask Eson2 mask and heated humidification. Pending insurance approval - if needed, patient may repeat sleep study. Patient expressed understanding and was given [facility] office number.  Prediabetes Last A1c 6.0 a year ago, no hx DM. Collect A1c today.   RTC in 1 week for BP fu, BMP, and lipid panel.   Damien Cassis, MD [facility]  "

## 2024-05-04 NOTE — Telephone Encounter (Signed)
-----   Message from Damien Cassis sent at 05/04/2024  3:08 PM EST ----- Regarding: CPAP DME order Hi team! I just placed a CPAP DME order for this patient today and put the physical paper in the RN box. Please let me know if there's anything else I can do. Thank you so much!   Damien

## 2024-05-04 NOTE — Patient Instructions (Addendum)
 Thank you for visiting clinic today and allowing us  to participate in your care!  You blood pressure is high today. We are changing your medication. Please take amlodipine -olmesartan  daily. Stop the amlodipine  only. If you have chest pain, difficulty breathing, bad headaches, or vision changes, please go to the Emergency department.  Call [phone] to plug back in with the sleep doctors. We have ordered a CPAP for you in the meantime.   Please schedule an appointment next week for blood pressure follow up and labwork.   Reach out any time with any questions or concerns you may have - we are here for you!  Damien Cassis, MD [facility] [phone]

## 2024-05-04 NOTE — Telephone Encounter (Signed)
Community message sent to [employer]. 

## 2024-05-05 ENCOUNTER — Other Ambulatory Visit: Payer: Self-pay

## 2024-05-05 NOTE — Assessment & Plan Note (Addendum)
 Last A1c 6.0 a year ago, no hx DM. Collect A1c today.

## 2024-05-05 NOTE — Assessment & Plan Note (Signed)
 Per chart review, patient completed sleep study with [facility] in 2018. CPAP DME ordered per settings previously noted by Pulmonology: Trial of CPAP therapy on 11 cm H2O with a Medium size Fisher&Paykel Nasal Mask Eson2 mask and heated humidification. Pending insurance approval - if needed, patient may repeat sleep study. Patient expressed understanding and was given [facility] office number.

## 2024-05-05 NOTE — Assessment & Plan Note (Signed)
 BP remains elevated on repeat today. Patient asymptomatic. Discussed updating BP regimen to Azor  5-20. Plan for patient to return to clinic next week for BP fu and kidney function check. Urgent precautions discussed.

## 2024-05-10 NOTE — Telephone Encounter (Signed)
 Per Dr. Sherre 1/16 note, she discussed possibility of needing a pulm referral and gave the phone number for [facility] in that visit.  Placing an ambulatory referral to pulm for split night study.  Adding Margit as FYI.  Page, please call patient and let him know to go ahead and schedule in a week or so once referral is processed.

## 2024-05-10 NOTE — Telephone Encounter (Signed)
 Please see message below from [employer].  hello,   Patient will need a new sleep study. This study is over 75 years old with no setup.   Thank you,   Arvella New   Will forward to PCP.

## 2024-05-11 ENCOUNTER — Ambulatory Visit (INDEPENDENT_AMBULATORY_CARE_PROVIDER_SITE_OTHER): Admitting: Family Medicine

## 2024-05-11 VITALS — BP 166/85 | HR 75 | Ht 65.0 in | Wt 193.8 lb

## 2024-05-11 DIAGNOSIS — I10 Essential (primary) hypertension: Secondary | ICD-10-CM | POA: Diagnosis present

## 2024-05-11 NOTE — Progress Notes (Signed)
" ° ° °  SUBJECTIVE:   CHIEF COMPLAINT / HPI:   Discussed the use of AI scribe software for clinical note transcription with the patient, who gave verbal consent to proceed.  History of Present Illness John Compton is a 74 year old male with hypertension who presents for a follow-up on his blood pressure management.  Hypertension management - Continues to take amlodipine  for blood pressure control. - Prescribed a new antihypertensive medication, but has not started it due to insurance coverage issues. - New medication is available at the pharmacy but costs $189 and is not affordable without insurance coverage. - Insurance will cover the new medication starting February 1st.    PERTINENT  PMH / PSH: HTN  OBJECTIVE:   BP (!) 166/85   Pulse 75   Ht 5' 5 (1.651 m)   Wt 193 lb 12.8 oz (87.9 kg)   BMI 32.25 kg/m   General: Alert, pleasant man. NAD. HEENT: NCAT. MMM. CV: RRR, no murmurs. Cap refill <2. Resp: CTAB, no wheezing or crackles. Normal WOB on RA.   Ext: Moves all ext spontaneously Skin: Warm, well perfused   ASSESSMENT/PLAN:   Assessment & Plan Primary hypertension Above goal but stable compared to last visit. Due to insurance issues, pt has not switched to Azor . - Cont amlodipine  for now - Plan to start Azor  on Feb 1, advised to f/u 1-2 wks after starting. Plan to recheck BP and BMP at that time.      Twyla Nearing, MD [facility] "

## 2024-05-11 NOTE — Addendum Note (Signed)
 Addended byBETHA TOMA MATAS on: 05/11/2024 08:39 AM   Modules accepted: Orders

## 2024-05-13 NOTE — Telephone Encounter (Signed)
 Called patient.   [facility] office information given to patient.   Patient advised to call to set up sleep study.   Patient was appreciative.

## 2024-06-10 ENCOUNTER — Ambulatory Visit: Admitting: Pulmonary Disease

## 2024-06-17 ENCOUNTER — Ambulatory Visit: Payer: Self-pay | Admitting: Family Medicine
